# Patient Record
Sex: Male | Born: 1940
Health system: Southern US, Community
[De-identification: ages and names within clinical notes are randomized; demographics above are authoritative.]

## PROBLEM LIST (undated history)

## (undated) DIAGNOSIS — I639 Cerebral infarction, unspecified: Secondary | ICD-10-CM

## (undated) DIAGNOSIS — K219 Gastro-esophageal reflux disease without esophagitis: Secondary | ICD-10-CM

## (undated) DIAGNOSIS — I10 Essential (primary) hypertension: Secondary | ICD-10-CM

## (undated) DIAGNOSIS — I219 Acute myocardial infarction, unspecified: Secondary | ICD-10-CM

## (undated) DIAGNOSIS — C801 Malignant (primary) neoplasm, unspecified: Secondary | ICD-10-CM

## (undated) DIAGNOSIS — E7800 Pure hypercholesterolemia, unspecified: Secondary | ICD-10-CM

## (undated) DIAGNOSIS — F419 Anxiety disorder, unspecified: Secondary | ICD-10-CM

## (undated) DIAGNOSIS — E119 Type 2 diabetes mellitus without complications: Secondary | ICD-10-CM

## (undated) DIAGNOSIS — K635 Polyp of colon: Secondary | ICD-10-CM

## (undated) DIAGNOSIS — F32A Depression, unspecified: Secondary | ICD-10-CM

## (undated) HISTORY — DX: Cerebral infarction, unspecified: I63.9

## (undated) HISTORY — PX: OTHER SURGICAL HISTORY: SHX169

## (undated) HISTORY — PX: COLONOSCOPY: SHX174

## (undated) HISTORY — PX: CARDIAC CATHETERIZATION: SHX172

---

## 1995-06-26 DIAGNOSIS — I219 Acute myocardial infarction, unspecified: Secondary | ICD-10-CM

## 1995-06-26 HISTORY — DX: Acute myocardial infarction, unspecified: I21.9

## 1995-06-26 HISTORY — PX: CORONARY ANGIOPLASTY: SHX604

## 2000-06-25 HISTORY — PX: CORONARY ANGIOPLASTY WITH STENT PLACEMENT: SHX49

## 2010-05-17 ENCOUNTER — Ambulatory Visit: Payer: Self-pay | Admitting: Internal Medicine

## 2010-05-17 ENCOUNTER — Ambulatory Visit (HOSPITAL_COMMUNITY): Admission: RE | Admit: 2010-05-17 | Discharge: 2010-05-17 | Payer: Self-pay | Admitting: Internal Medicine

## 2015-04-28 ENCOUNTER — Encounter (INDEPENDENT_AMBULATORY_CARE_PROVIDER_SITE_OTHER): Payer: Self-pay | Admitting: *Deleted

## 2015-05-13 ENCOUNTER — Other Ambulatory Visit (INDEPENDENT_AMBULATORY_CARE_PROVIDER_SITE_OTHER): Payer: Self-pay | Admitting: *Deleted

## 2015-05-13 ENCOUNTER — Telehealth (INDEPENDENT_AMBULATORY_CARE_PROVIDER_SITE_OTHER): Payer: Self-pay | Admitting: *Deleted

## 2015-05-13 DIAGNOSIS — Z86010 Personal history of colonic polyps: Secondary | ICD-10-CM

## 2015-05-13 DIAGNOSIS — Z1211 Encounter for screening for malignant neoplasm of colon: Secondary | ICD-10-CM

## 2015-05-13 MED ORDER — PEG 3350-KCL-NA BICARB-NACL 420 G PO SOLR: 4000.0000 mL | Freq: Once | ORAL | Status: DC

## 2015-05-13 NOTE — Telephone Encounter (Signed)
Referring MD/PCP: hasanaj   Procedure: tcs  Reason/Indication:  Hx polyps  Has patient had this procedure before?  Yes, 2011 -- paper chart  If so, when, by whom and where?    Is there a family history of colon cancer?    Who?  What age when diagnosed?    Is patient diabetic?   yes      Does patient have prosthetic heart valve or mechanical valve?  no  Do you have a pacemaker?  no  Has patient ever had endocarditis? no  Has patient had joint replacement within last 12 months?  no  Does patient tend to be constipated or take laxatives? no  Does patient have a history of alcohol/drug use?  no  Is patient on Coumadin, Plavix and/or Aspirin? yes  Medications: asa 81 mg daily, Prilosec otc 1 tab every other day, vit c 1000 mg daily, omega 3 fish oil 180 mg bid, centrum silver daily, Ramipril 125 mg daily, vit d3 1000 iu daily, fenofibrate 145 mg daily, atorvastatin 40 mg daily, metformin 1000 mg bid (am & pm)  Allergies: IVP dye  Medication Adjustment: asa 2 days, hold metformin evening before & morning of  Procedure date & time: 05/27/15 at 1030

## 2015-05-13 NOTE — Telephone Encounter (Signed)
agree

## 2015-05-13 NOTE — Telephone Encounter (Signed)
Patient needs trilyte 

## 2015-05-27 ENCOUNTER — Encounter (HOSPITAL_COMMUNITY)
Admission: RE | Disposition: A | Discharge status: Home / Self Care | Payer: Self-pay | Source: Ambulatory Visit | Attending: Internal Medicine

## 2015-05-27 ENCOUNTER — Encounter (HOSPITAL_COMMUNITY): Payer: Self-pay | Admitting: *Deleted

## 2015-05-27 ENCOUNTER — Ambulatory Visit (HOSPITAL_COMMUNITY)
Admission: RE | Admit: 2015-05-27 | Discharge: 2015-05-27 | Disposition: A | Discharge status: Home / Self Care | Payer: Medicare Other | Source: Ambulatory Visit | Attending: Internal Medicine | Admitting: Internal Medicine

## 2015-05-27 DIAGNOSIS — Z1211 Encounter for screening for malignant neoplasm of colon: Secondary | ICD-10-CM | POA: Diagnosis present

## 2015-05-27 DIAGNOSIS — Z87891 Personal history of nicotine dependence: Secondary | ICD-10-CM | POA: Insufficient documentation

## 2015-05-27 DIAGNOSIS — E7800 Pure hypercholesterolemia, unspecified: Secondary | ICD-10-CM | POA: Insufficient documentation

## 2015-05-27 DIAGNOSIS — Z7984 Long term (current) use of oral hypoglycemic drugs: Secondary | ICD-10-CM | POA: Diagnosis not present

## 2015-05-27 DIAGNOSIS — Z7982 Long term (current) use of aspirin: Secondary | ICD-10-CM | POA: Insufficient documentation

## 2015-05-27 DIAGNOSIS — D125 Benign neoplasm of sigmoid colon: Secondary | ICD-10-CM

## 2015-05-27 DIAGNOSIS — I252 Old myocardial infarction: Secondary | ICD-10-CM | POA: Diagnosis not present

## 2015-05-27 DIAGNOSIS — E119 Type 2 diabetes mellitus without complications: Secondary | ICD-10-CM | POA: Diagnosis not present

## 2015-05-27 DIAGNOSIS — I10 Essential (primary) hypertension: Secondary | ICD-10-CM | POA: Insufficient documentation

## 2015-05-27 DIAGNOSIS — Z79899 Other long term (current) drug therapy: Secondary | ICD-10-CM | POA: Diagnosis not present

## 2015-05-27 DIAGNOSIS — K648 Other hemorrhoids: Secondary | ICD-10-CM

## 2015-05-27 DIAGNOSIS — Z85038 Personal history of other malignant neoplasm of large intestine: Secondary | ICD-10-CM | POA: Insufficient documentation

## 2015-05-27 DIAGNOSIS — Z86010 Personal history of colonic polyps: Secondary | ICD-10-CM | POA: Diagnosis not present

## 2015-05-27 DIAGNOSIS — K219 Gastro-esophageal reflux disease without esophagitis: Secondary | ICD-10-CM | POA: Insufficient documentation

## 2015-05-27 DIAGNOSIS — K644 Residual hemorrhoidal skin tags: Secondary | ICD-10-CM | POA: Diagnosis not present

## 2015-05-27 DIAGNOSIS — K5730 Diverticulosis of large intestine without perforation or abscess without bleeding: Secondary | ICD-10-CM | POA: Diagnosis not present

## 2015-05-27 HISTORY — DX: Pure hypercholesterolemia, unspecified: E78.00

## 2015-05-27 HISTORY — DX: Essential (primary) hypertension: I10

## 2015-05-27 HISTORY — PX: COLONOSCOPY: SHX5424

## 2015-05-27 HISTORY — DX: Polyp of colon: K63.5

## 2015-05-27 HISTORY — DX: Gastro-esophageal reflux disease without esophagitis: K21.9

## 2015-05-27 HISTORY — DX: Malignant (primary) neoplasm, unspecified: C80.1

## 2015-05-27 HISTORY — DX: Type 2 diabetes mellitus without complications: E11.9

## 2015-05-27 HISTORY — DX: Acute myocardial infarction, unspecified: I21.9

## 2015-05-27 LAB — GLUCOSE, CAPILLARY: Glucose-Capillary: 71 mg/dL (ref 65–99)

## 2015-05-27 SURGERY — COLONOSCOPY
Anesthesia: Moderate Sedation

## 2015-05-27 MED ORDER — MEPERIDINE HCL 50 MG/ML IJ SOLN
INTRAMUSCULAR | Status: DC | PRN
  Administered 2015-05-27 (×2): 25 mg via INTRAVENOUS

## 2015-05-27 MED ORDER — SIMETHICONE 40 MG/0.6ML PO SUSP
ORAL | Status: DC | PRN
  Administered 2015-05-27: 11:00:00

## 2015-05-27 MED ORDER — MIDAZOLAM HCL 5 MG/5ML IJ SOLN
INTRAMUSCULAR | Status: DC | PRN
  Administered 2015-05-27 (×2): 1 mg via INTRAVENOUS
  Administered 2015-05-27: 2 mg via INTRAVENOUS

## 2015-05-27 MED ORDER — MEPERIDINE HCL 50 MG/ML IJ SOLN
INTRAMUSCULAR | Status: AC
  Filled 2015-05-27: qty 1

## 2015-05-27 MED ORDER — MIDAZOLAM HCL 5 MG/5ML IJ SOLN
INTRAMUSCULAR | Status: AC
  Filled 2015-05-27: qty 10

## 2015-05-27 MED ORDER — SODIUM CHLORIDE 0.9 % IV SOLN
INTRAVENOUS | Status: DC
  Administered 2015-05-27: 10:00:00 via INTRAVENOUS

## 2015-05-27 NOTE — Discharge Instructions (Addendum)
Resume usual medications and diet. °No driving for 24 hours. °Physician will call with biopsy results. ° ° °Colonoscopy, Care After °These instructions give you information on caring for yourself after your procedure. Your doctor may also give you more specific instructions. Call your doctor if you have any problems or questions after your procedure. °HOME CARE °· Do not drive for 24 hours. °· Do not sign important papers or use machinery for 24 hours. °· You may shower. °· You may go back to your usual activities, but go slower for the first 24 hours. °· Take rest breaks often during the first 24 hours. °· Walk around or use warm packs on your belly (abdomen) if you have belly cramping or gas. °· Drink enough fluids to keep your pee (urine) clear or pale yellow. °· Resume your normal diet. Avoid heavy or fried foods. °· Avoid drinking alcohol for 24 hours or as told by your doctor. °· Only take medicines as told by your doctor. °If a tissue sample (biopsy) was taken during the procedure:  °· Do not take aspirin or blood thinners for 7 days, or as told by your doctor. °· Do not drink alcohol for 7 days, or as told by your doctor. °· Eat soft foods for the first 24 hours. °GET HELP IF: °You still have a small amount of blood in your poop (stool) 2-3 days after the procedure. °GET HELP RIGHT AWAY IF: °· You have more than a small amount of blood in your poop. °· You see clumps of tissue (blood clots) in your poop. °· Your belly is puffy (swollen). °· You feel sick to your stomach (nauseous) or throw up (vomit). °· You have a fever. °· You have belly pain that gets worse and medicine does not help. °MAKE SURE YOU: °· Understand these instructions. °· Will watch your condition. °· Will get help right away if you are not doing well or get worse. °  °This information is not intended to replace advice given to you by your health care provider. Make sure you discuss any questions you have with your health care provider. °    °Document Released: 07/14/2010 Document Revised: 06/16/2013 Document Reviewed: 02/16/2013 °Elsevier Interactive Patient Education ©2016 Elsevier Inc[employer] ° °Colon Polyps °Polyps are lumps of extra tissue growing inside the body. Polyps can grow in the large intestine (colon). Most colon polyps are noncancerous (benign). However, some colon polyps can become cancerous over time. Polyps that are larger than a pea may be harmful. To be safe, caregivers remove and test all polyps. °CAUSES  °Polyps form when mutations in the genes cause your cells to grow and divide even though no more tissue is needed. °RISK FACTORS °There are a number of risk factors that can increase your chances of getting colon polyps. They include: °· Being older than 50 years. °· Family history of colon polyps or colon cancer. °· Long-term colon diseases, such as colitis or Crohn disease. °· Being overweight. °· Smoking. °· Being inactive. °· Drinking too much alcohol. °SYMPTOMS  °Most small polyps do not cause symptoms. If symptoms are present, they may include: °· Blood in the stool. The stool may look dark red or black. °· Constipation or diarrhea that lasts longer than 1 week. °DIAGNOSIS °People often do not know they have polyps until their caregiver finds them during a regular checkup. Your caregiver can use 4 tests to check for polyps: °· Digital rectal exam. The caregiver wears gloves and feels inside the rectum.   This test would find polyps only in the rectum. °· Barium enema. The caregiver puts a liquid called barium into your rectum before taking X-rays of your colon. Barium makes your colon look white. Polyps are dark, so they are easy to see in the X-ray pictures. °· Sigmoidoscopy. A thin, flexible tube (sigmoidoscope) is placed into your rectum. The sigmoidoscope has a light and tiny camera in it. The caregiver uses the sigmoidoscope to look at the last third of your colon. °· Colonoscopy. This test is like sigmoidoscopy, but the  caregiver looks at the entire colon. This is the most common method for finding and removing polyps. °TREATMENT  °Any polyps will be removed during a sigmoidoscopy or colonoscopy. The polyps are then tested for cancer. °PREVENTION  °To help lower your risk of getting more colon polyps: °· Eat plenty of fruits and vegetables. Avoid eating fatty foods. °· Do not smoke. °· Avoid drinking alcohol. °· Exercise every day. °· Lose weight if recommended by your caregiver. °· Eat plenty of calcium and folate. Foods that are rich in calcium include milk, cheese, and broccoli. Foods that are rich in folate include chickpeas, kidney beans, and spinach. °HOME CARE INSTRUCTIONS °Keep all follow-up appointments as directed by your caregiver. You may need periodic exams to check for polyps. °SEEK MEDICAL CARE IF: °You notice bleeding during a bowel movement. °  °This information is not intended to replace advice given to you by your health care provider. Make sure you discuss any questions you have with your health care provider. °  °Document Released: 03/07/2004 Document Revised: 07/02/2014 Document Reviewed: 08/21/2011 °Elsevier Interactive Patient Education ©2016 Elsevier Inc. ° °

## 2015-05-27 NOTE — Op Note (Signed)
COLONOSCOPY PROCEDURE REPORT  PATIENT:  Billy Jacobson  MR#:  [id number] Birthdate:  1941/06/18, 74 y.o., male Endoscopist:  Dr. Rogene Houston, MD Referred By:  Dr. Velvet Bathe A. Sherrie Sport, MD Procedure Date: 05/27/2015  Procedure:   Colonoscopy  Indications:  Patient is 74 year old Caucasian male with history of colonic polyps. He is undergoing surveillance colonoscopy.  Informed Consent:  The procedure and risks were reviewed with the patient and informed consent was obtained.  Medications:  Demerol 50 mg IV Versed 4 mg IV  Description of procedure:  After a digital rectal exam was performed, that colonoscope was advanced from the anus through the rectum and colon to the area of the cecum, ileocecal valve and appendiceal orifice. The cecum was deeply intubated. These structures were well-seen and photographed for the record. From the level of the cecum and ileocecal valve, the scope was slowly and cautiously withdrawn. The mucosal surfaces were carefully surveyed utilizing scope tip to flexion to facilitate fold flattening as needed. The scope was pulled down into the rectum where a thorough exam including retroflexion was performed.  Findings:   Prep satisfactory. Single diverticulum noted at ascending colon along with two more at sigmoid colon. Small polyp at proximal sigmoid colon was removed via cold biopsy. Two other small polyps at sigmoid colon were cold snared. All three of these polyps were submitted together. Normal rectal mucosa. Small hemorrhoids above and below the dentate line.    Therapeutic/Diagnostic Maneuvers Performed:  See above  Complications:  None  EBL: None  Cecal Withdrawal Time: 17 minutes  Impression:  Examination performed to cecum. Single diverticulum at ascending colon along with two at sigmoid colon. Three small polyps removed from sigmoid colon and submitted together. Small internal and external hemorrhoids.  Recommendations:  Standard  instructions given. I will contact patient with biopsy results and further recommendations.  [name]  05/27/2015 11:17 AM  CC: Dr. Neale Burly, MD & Dr. Rayne Du ref. provider found

## 2015-05-27 NOTE — H&P (Signed)
Billy Jacobson is an 74 y.o. male.   Chief Complaint: Patient is here for colonoscopy. HPI: Patient is 74 year old Caucasian male was history of colonic polyps and is here for surveillance colonoscopy. He denies abdominal pain change in bowel habits or rectal bleeding. While he has had colonic adenomas on prior colonoscopies for the last exam in November 2011 revealed 2 hyperplastic polyps. Family history is negative for CRC.  Past Medical History  Diagnosis Date  . Diabetes mellitus without complication ([place])   . Hypertension   . Hypercholesteremia   . GERD (gastroesophageal reflux disease)   . Colon polyps   . Myocardial infarction ([place]) 1997  . Cancer [facility])     Colon cancer    Past Surgical History  Procedure Laterality Date  . Cardiac catheterization    . Colonoscopy    . Right inguinal hernia repair    . Left rotator cuff surgery      History reviewed. No pertinent family history. Social History:  reports that he has quit smoking. He does not have any smokeless tobacco history on file. He reports that he drinks alcohol. He reports that he does not use illicit drugs.  Allergies:  Allergies  Allergen Reactions  . Iodinated Diagnostic Agents     Medications Prior to Admission  Medication Sig Dispense Refill  . Ascorbic Acid (VITAMIN C) 1000 MG tablet Take 1,000 mg by mouth 2 (two) times a week.    Marland Kitchen aspirin EC 81 MG tablet Take 81 mg by mouth daily.    Marland Kitchen atorvastatin (LIPITOR) 40 MG tablet Take 40 mg by mouth daily.  0  . Cholecalciferol (VITAMIN D-3) 1000 UNITS CAPS Take 1 capsule by mouth daily.    . fenofibrate (TRICOR) 145 MG tablet Take 145 mg by mouth daily.  0  . metFORMIN (GLUCOPHAGE) 1000 MG tablet Take 1,000 mg by mouth 2 (two) times daily.  0  . Multiple Vitamins-Minerals (CENTRUM SILVER ADULT 50+ PO) Take 1 tablet by mouth daily.    . Omega-3 Fatty Acids (FISH OIL PO) Take 2 capsules by mouth daily.    Marland Kitchen omeprazole (PRILOSEC) 20 MG capsule Take 20 mg by  mouth every other day.  0  . polyethylene glycol-electrolytes (NULYTELY/GOLYTELY) 420 G solution Take 4,000 mLs by mouth once. 4000 mL 0  . ramipril (ALTACE) 1.25 MG capsule Take 1.25 mg by mouth daily.      Results for orders placed or performed during the hospital encounter of 05/27/15 (from the past 48 hour(s))  Glucose, capillary     Status: None   Collection Time: 05/27/15  9:52 AM  Result Value Ref Range   Glucose-Capillary 71 65 - 99 mg/dL   No results found.  ROS  Blood pressure 124/66, pulse 67, temperature 97.5 F (36.4 C), temperature source Oral, resp. rate 15, height 5\' 10"  (1.778 m), weight 155 lb (70.308 kg), SpO2 100 %. Physical Exam  Constitutional: He appears well-developed and well-nourished.  HENT:  Mouth/Throat: Oropharynx is clear and moist.  Eyes: Conjunctivae are normal. No scleral icterus.  Neck: No thyromegaly present.  Cardiovascular:  Occasional irregularity. Normal S1 and S2. No murmur or gallop.  Respiratory: Effort normal and breath sounds normal.  GI: Soft. He exhibits no distension and no mass. There is no tenderness.  Musculoskeletal: He exhibits no edema.  Lymphadenopathy:    He has no cervical adenopathy.  Neurological: He is alert.  Skin: Skin is warm and dry.     Assessment/Plan History of colonic polyps.  Surveillance colonoscopy.  Billy Jacobson U 05/27/2015, 10:31 AM

## 2015-05-30 ENCOUNTER — Encounter (HOSPITAL_COMMUNITY): Payer: Self-pay | Admitting: Internal Medicine

## 2015-07-22 DIAGNOSIS — Z6822 Body mass index (BMI) 22.0-22.9, adult: Secondary | ICD-10-CM | POA: Diagnosis not present

## 2015-07-22 DIAGNOSIS — E1165 Type 2 diabetes mellitus with hyperglycemia: Secondary | ICD-10-CM | POA: Diagnosis not present

## 2015-07-22 DIAGNOSIS — Z0000 Encounter for general adult medical examination without abnormal findings: Secondary | ICD-10-CM | POA: Diagnosis not present

## 2015-07-22 DIAGNOSIS — I10 Essential (primary) hypertension: Secondary | ICD-10-CM | POA: Diagnosis not present

## 2015-07-27 ENCOUNTER — Other Ambulatory Visit: Payer: Self-pay | Admitting: Dermatology

## 2015-07-27 DIAGNOSIS — L570 Actinic keratosis: Secondary | ICD-10-CM | POA: Diagnosis not present

## 2015-07-27 DIAGNOSIS — D239 Other benign neoplasm of skin, unspecified: Secondary | ICD-10-CM | POA: Diagnosis not present

## 2015-09-05 DIAGNOSIS — M5431 Sciatica, right side: Secondary | ICD-10-CM | POA: Diagnosis not present

## 2015-09-05 DIAGNOSIS — Z6823 Body mass index (BMI) 23.0-23.9, adult: Secondary | ICD-10-CM | POA: Diagnosis not present

## 2015-09-07 DIAGNOSIS — M545 Low back pain: Secondary | ICD-10-CM | POA: Diagnosis not present

## 2015-09-07 DIAGNOSIS — S3992XA Unspecified injury of lower back, initial encounter: Secondary | ICD-10-CM | POA: Diagnosis not present

## 2015-09-07 DIAGNOSIS — M5106 Intervertebral disc disorders with myelopathy, lumbar region: Secondary | ICD-10-CM | POA: Diagnosis not present

## 2015-09-07 DIAGNOSIS — M4716 Other spondylosis with myelopathy, lumbar region: Secondary | ICD-10-CM | POA: Diagnosis not present

## 2015-10-06 DIAGNOSIS — M47817 Spondylosis without myelopathy or radiculopathy, lumbosacral region: Secondary | ICD-10-CM | POA: Diagnosis not present

## 2015-10-06 DIAGNOSIS — M5136 Other intervertebral disc degeneration, lumbar region: Secondary | ICD-10-CM | POA: Diagnosis not present

## 2015-10-06 DIAGNOSIS — M9983 Other biomechanical lesions of lumbar region: Secondary | ICD-10-CM | POA: Diagnosis not present

## 2015-10-06 DIAGNOSIS — M4317 Spondylolisthesis, lumbosacral region: Secondary | ICD-10-CM | POA: Diagnosis not present

## 2015-10-19 DIAGNOSIS — M4317 Spondylolisthesis, lumbosacral region: Secondary | ICD-10-CM | POA: Diagnosis not present

## 2015-10-19 DIAGNOSIS — Z79899 Other long term (current) drug therapy: Secondary | ICD-10-CM | POA: Diagnosis not present

## 2015-10-19 DIAGNOSIS — M5136 Other intervertebral disc degeneration, lumbar region: Secondary | ICD-10-CM | POA: Diagnosis not present

## 2015-10-19 DIAGNOSIS — Z7984 Long term (current) use of oral hypoglycemic drugs: Secondary | ICD-10-CM | POA: Diagnosis not present

## 2015-10-19 DIAGNOSIS — M47817 Spondylosis without myelopathy or radiculopathy, lumbosacral region: Secondary | ICD-10-CM | POA: Diagnosis not present

## 2015-10-19 DIAGNOSIS — M9983 Other biomechanical lesions of lumbar region: Secondary | ICD-10-CM | POA: Diagnosis not present

## 2015-10-19 DIAGNOSIS — M47816 Spondylosis without myelopathy or radiculopathy, lumbar region: Secondary | ICD-10-CM | POA: Diagnosis not present

## 2015-11-25 DIAGNOSIS — M7551 Bursitis of right shoulder: Secondary | ICD-10-CM | POA: Diagnosis not present

## 2015-11-25 DIAGNOSIS — M25511 Pain in right shoulder: Secondary | ICD-10-CM | POA: Diagnosis not present

## 2015-12-28 ENCOUNTER — Other Ambulatory Visit: Payer: Self-pay | Admitting: Dermatology

## 2015-12-28 DIAGNOSIS — D239 Other benign neoplasm of skin, unspecified: Secondary | ICD-10-CM | POA: Diagnosis not present

## 2015-12-28 DIAGNOSIS — L299 Pruritus, unspecified: Secondary | ICD-10-CM | POA: Diagnosis not present

## 2015-12-28 DIAGNOSIS — D485 Neoplasm of uncertain behavior of skin: Secondary | ICD-10-CM | POA: Diagnosis not present

## 2015-12-28 DIAGNOSIS — H61001 Unspecified perichondritis of right external ear: Secondary | ICD-10-CM | POA: Diagnosis not present

## 2016-02-03 DIAGNOSIS — Z125 Encounter for screening for malignant neoplasm of prostate: Secondary | ICD-10-CM | POA: Diagnosis not present

## 2016-02-03 DIAGNOSIS — E1165 Type 2 diabetes mellitus with hyperglycemia: Secondary | ICD-10-CM | POA: Diagnosis not present

## 2016-02-03 DIAGNOSIS — E119 Type 2 diabetes mellitus without complications: Secondary | ICD-10-CM | POA: Diagnosis not present

## 2016-02-03 DIAGNOSIS — K219 Gastro-esophageal reflux disease without esophagitis: Secondary | ICD-10-CM | POA: Diagnosis not present

## 2016-02-03 DIAGNOSIS — E782 Mixed hyperlipidemia: Secondary | ICD-10-CM | POA: Diagnosis not present

## 2016-02-03 DIAGNOSIS — E784 Other hyperlipidemia: Secondary | ICD-10-CM | POA: Diagnosis not present

## 2016-03-12 DIAGNOSIS — M25511 Pain in right shoulder: Secondary | ICD-10-CM | POA: Diagnosis not present

## 2016-03-16 DIAGNOSIS — M75101 Unspecified rotator cuff tear or rupture of right shoulder, not specified as traumatic: Secondary | ICD-10-CM | POA: Diagnosis not present

## 2016-03-16 DIAGNOSIS — M7581 Other shoulder lesions, right shoulder: Secondary | ICD-10-CM | POA: Diagnosis not present

## 2016-03-16 DIAGNOSIS — M7551 Bursitis of right shoulder: Secondary | ICD-10-CM | POA: Diagnosis not present

## 2016-03-16 DIAGNOSIS — S46011A Strain of muscle(s) and tendon(s) of the rotator cuff of right shoulder, initial encounter: Secondary | ICD-10-CM | POA: Diagnosis not present

## 2016-03-19 DIAGNOSIS — M75121 Complete rotator cuff tear or rupture of right shoulder, not specified as traumatic: Secondary | ICD-10-CM | POA: Diagnosis not present

## 2016-04-02 DIAGNOSIS — M75111 Incomplete rotator cuff tear or rupture of right shoulder, not specified as traumatic: Secondary | ICD-10-CM | POA: Diagnosis not present

## 2016-04-02 DIAGNOSIS — M7521 Bicipital tendinitis, right shoulder: Secondary | ICD-10-CM | POA: Diagnosis not present

## 2016-05-22 DIAGNOSIS — Z955 Presence of coronary angioplasty implant and graft: Secondary | ICD-10-CM | POA: Diagnosis not present

## 2016-05-22 DIAGNOSIS — Z7984 Long term (current) use of oral hypoglycemic drugs: Secondary | ICD-10-CM | POA: Diagnosis not present

## 2016-05-22 DIAGNOSIS — R740 Nonspecific elevation of levels of transaminase and lactic acid dehydrogenase [LDH]: Secondary | ICD-10-CM | POA: Diagnosis not present

## 2016-05-22 DIAGNOSIS — I252 Old myocardial infarction: Secondary | ICD-10-CM | POA: Diagnosis not present

## 2016-05-22 DIAGNOSIS — Z7982 Long term (current) use of aspirin: Secondary | ICD-10-CM | POA: Diagnosis not present

## 2016-05-22 DIAGNOSIS — Z79899 Other long term (current) drug therapy: Secondary | ICD-10-CM | POA: Diagnosis not present

## 2016-05-22 DIAGNOSIS — E785 Hyperlipidemia, unspecified: Secondary | ICD-10-CM | POA: Diagnosis not present

## 2016-05-22 DIAGNOSIS — I10 Essential (primary) hypertension: Secondary | ICD-10-CM | POA: Diagnosis not present

## 2016-05-22 DIAGNOSIS — I259 Chronic ischemic heart disease, unspecified: Secondary | ICD-10-CM | POA: Diagnosis not present

## 2016-06-11 DIAGNOSIS — M79641 Pain in right hand: Secondary | ICD-10-CM | POA: Diagnosis not present

## 2016-06-11 DIAGNOSIS — G5601 Carpal tunnel syndrome, right upper limb: Secondary | ICD-10-CM | POA: Diagnosis not present

## 2016-06-21 DIAGNOSIS — H40053 Ocular hypertension, bilateral: Secondary | ICD-10-CM | POA: Diagnosis not present

## 2016-06-21 DIAGNOSIS — H524 Presbyopia: Secondary | ICD-10-CM | POA: Diagnosis not present

## 2016-07-27 ENCOUNTER — Encounter: Payer: Self-pay | Admitting: Internal Medicine

## 2016-07-31 DIAGNOSIS — L821 Other seborrheic keratosis: Secondary | ICD-10-CM | POA: Diagnosis not present

## 2016-07-31 DIAGNOSIS — L570 Actinic keratosis: Secondary | ICD-10-CM | POA: Diagnosis not present

## 2016-08-03 DIAGNOSIS — K219 Gastro-esophageal reflux disease without esophagitis: Secondary | ICD-10-CM | POA: Diagnosis not present

## 2016-08-03 DIAGNOSIS — E784 Other hyperlipidemia: Secondary | ICD-10-CM | POA: Diagnosis not present

## 2016-08-03 DIAGNOSIS — I10 Essential (primary) hypertension: Secondary | ICD-10-CM | POA: Diagnosis not present

## 2016-08-03 DIAGNOSIS — Z1389 Encounter for screening for other disorder: Secondary | ICD-10-CM | POA: Diagnosis not present

## 2016-08-03 DIAGNOSIS — E1165 Type 2 diabetes mellitus with hyperglycemia: Secondary | ICD-10-CM | POA: Diagnosis not present

## 2016-08-03 DIAGNOSIS — Z0000 Encounter for general adult medical examination without abnormal findings: Secondary | ICD-10-CM | POA: Diagnosis not present

## 2016-08-06 DIAGNOSIS — M2392 Unspecified internal derangement of left knee: Secondary | ICD-10-CM | POA: Diagnosis not present

## 2016-08-06 DIAGNOSIS — M25562 Pain in left knee: Secondary | ICD-10-CM | POA: Diagnosis not present

## 2016-08-20 DIAGNOSIS — M2392 Unspecified internal derangement of left knee: Secondary | ICD-10-CM | POA: Diagnosis not present

## 2016-09-27 DIAGNOSIS — M9901 Segmental and somatic dysfunction of cervical region: Secondary | ICD-10-CM | POA: Diagnosis not present

## 2016-09-27 DIAGNOSIS — M9903 Segmental and somatic dysfunction of lumbar region: Secondary | ICD-10-CM | POA: Diagnosis not present

## 2016-09-27 DIAGNOSIS — M9902 Segmental and somatic dysfunction of thoracic region: Secondary | ICD-10-CM | POA: Diagnosis not present

## 2016-09-27 DIAGNOSIS — S233XXA Sprain of ligaments of thoracic spine, initial encounter: Secondary | ICD-10-CM | POA: Diagnosis not present

## 2016-09-27 DIAGNOSIS — M546 Pain in thoracic spine: Secondary | ICD-10-CM | POA: Diagnosis not present

## 2016-10-04 DIAGNOSIS — M546 Pain in thoracic spine: Secondary | ICD-10-CM | POA: Diagnosis not present

## 2016-10-04 DIAGNOSIS — M9902 Segmental and somatic dysfunction of thoracic region: Secondary | ICD-10-CM | POA: Diagnosis not present

## 2016-10-04 DIAGNOSIS — M9903 Segmental and somatic dysfunction of lumbar region: Secondary | ICD-10-CM | POA: Diagnosis not present

## 2016-10-04 DIAGNOSIS — S233XXA Sprain of ligaments of thoracic spine, initial encounter: Secondary | ICD-10-CM | POA: Diagnosis not present

## 2016-10-04 DIAGNOSIS — M9901 Segmental and somatic dysfunction of cervical region: Secondary | ICD-10-CM | POA: Diagnosis not present

## 2016-10-09 DIAGNOSIS — M9901 Segmental and somatic dysfunction of cervical region: Secondary | ICD-10-CM | POA: Diagnosis not present

## 2016-10-09 DIAGNOSIS — M9902 Segmental and somatic dysfunction of thoracic region: Secondary | ICD-10-CM | POA: Diagnosis not present

## 2016-10-09 DIAGNOSIS — M546 Pain in thoracic spine: Secondary | ICD-10-CM | POA: Diagnosis not present

## 2016-10-09 DIAGNOSIS — S233XXA Sprain of ligaments of thoracic spine, initial encounter: Secondary | ICD-10-CM | POA: Diagnosis not present

## 2016-10-09 DIAGNOSIS — M9903 Segmental and somatic dysfunction of lumbar region: Secondary | ICD-10-CM | POA: Diagnosis not present

## 2016-10-16 DIAGNOSIS — M9901 Segmental and somatic dysfunction of cervical region: Secondary | ICD-10-CM | POA: Diagnosis not present

## 2016-10-16 DIAGNOSIS — S233XXA Sprain of ligaments of thoracic spine, initial encounter: Secondary | ICD-10-CM | POA: Diagnosis not present

## 2016-10-16 DIAGNOSIS — M9903 Segmental and somatic dysfunction of lumbar region: Secondary | ICD-10-CM | POA: Diagnosis not present

## 2016-10-16 DIAGNOSIS — M546 Pain in thoracic spine: Secondary | ICD-10-CM | POA: Diagnosis not present

## 2016-10-16 DIAGNOSIS — M9902 Segmental and somatic dysfunction of thoracic region: Secondary | ICD-10-CM | POA: Diagnosis not present

## 2016-10-23 DIAGNOSIS — S233XXA Sprain of ligaments of thoracic spine, initial encounter: Secondary | ICD-10-CM | POA: Diagnosis not present

## 2016-10-23 DIAGNOSIS — M546 Pain in thoracic spine: Secondary | ICD-10-CM | POA: Diagnosis not present

## 2016-10-23 DIAGNOSIS — M9902 Segmental and somatic dysfunction of thoracic region: Secondary | ICD-10-CM | POA: Diagnosis not present

## 2016-10-23 DIAGNOSIS — M9903 Segmental and somatic dysfunction of lumbar region: Secondary | ICD-10-CM | POA: Diagnosis not present

## 2016-10-23 DIAGNOSIS — M9901 Segmental and somatic dysfunction of cervical region: Secondary | ICD-10-CM | POA: Diagnosis not present

## 2016-10-30 DIAGNOSIS — S233XXA Sprain of ligaments of thoracic spine, initial encounter: Secondary | ICD-10-CM | POA: Diagnosis not present

## 2016-10-30 DIAGNOSIS — M9902 Segmental and somatic dysfunction of thoracic region: Secondary | ICD-10-CM | POA: Diagnosis not present

## 2016-10-30 DIAGNOSIS — M9903 Segmental and somatic dysfunction of lumbar region: Secondary | ICD-10-CM | POA: Diagnosis not present

## 2016-10-30 DIAGNOSIS — M9901 Segmental and somatic dysfunction of cervical region: Secondary | ICD-10-CM | POA: Diagnosis not present

## 2016-10-30 DIAGNOSIS — M546 Pain in thoracic spine: Secondary | ICD-10-CM | POA: Diagnosis not present

## 2016-12-13 DIAGNOSIS — K219 Gastro-esophageal reflux disease without esophagitis: Secondary | ICD-10-CM | POA: Diagnosis not present

## 2016-12-13 DIAGNOSIS — Z87891 Personal history of nicotine dependence: Secondary | ICD-10-CM | POA: Diagnosis not present

## 2016-12-13 DIAGNOSIS — E119 Type 2 diabetes mellitus without complications: Secondary | ICD-10-CM | POA: Diagnosis not present

## 2016-12-13 DIAGNOSIS — M549 Dorsalgia, unspecified: Secondary | ICD-10-CM | POA: Diagnosis not present

## 2016-12-13 DIAGNOSIS — Z7952 Long term (current) use of systemic steroids: Secondary | ICD-10-CM | POA: Diagnosis not present

## 2016-12-13 DIAGNOSIS — M810 Age-related osteoporosis without current pathological fracture: Secondary | ICD-10-CM | POA: Diagnosis not present

## 2016-12-13 DIAGNOSIS — E7800 Pure hypercholesterolemia, unspecified: Secondary | ICD-10-CM | POA: Diagnosis not present

## 2016-12-13 DIAGNOSIS — Z79899 Other long term (current) drug therapy: Secondary | ICD-10-CM | POA: Diagnosis not present

## 2016-12-13 DIAGNOSIS — Z7984 Long term (current) use of oral hypoglycemic drugs: Secondary | ICD-10-CM | POA: Diagnosis not present

## 2017-01-28 DIAGNOSIS — H40023 Open angle with borderline findings, high risk, bilateral: Secondary | ICD-10-CM | POA: Diagnosis not present

## 2017-01-29 DIAGNOSIS — Z6822 Body mass index (BMI) 22.0-22.9, adult: Secondary | ICD-10-CM | POA: Diagnosis not present

## 2017-01-29 DIAGNOSIS — E1165 Type 2 diabetes mellitus with hyperglycemia: Secondary | ICD-10-CM | POA: Diagnosis not present

## 2017-01-29 DIAGNOSIS — I10 Essential (primary) hypertension: Secondary | ICD-10-CM | POA: Diagnosis not present

## 2017-01-29 DIAGNOSIS — E784 Other hyperlipidemia: Secondary | ICD-10-CM | POA: Diagnosis not present

## 2017-01-29 DIAGNOSIS — K219 Gastro-esophageal reflux disease without esophagitis: Secondary | ICD-10-CM | POA: Diagnosis not present

## 2017-02-05 ENCOUNTER — Other Ambulatory Visit: Payer: Self-pay | Admitting: Internal Medicine

## 2017-02-05 DIAGNOSIS — M545 Low back pain: Principal | ICD-10-CM

## 2017-02-05 DIAGNOSIS — G8929 Other chronic pain: Secondary | ICD-10-CM

## 2017-02-08 ENCOUNTER — Ambulatory Visit
Admission: RE | Admit: 2017-02-08 | Discharge: 2017-02-08 | Disposition: A | Discharge status: Home / Self Care | Payer: Medicare Other | Source: Ambulatory Visit | Attending: Internal Medicine | Admitting: Internal Medicine

## 2017-02-08 DIAGNOSIS — M5126 Other intervertebral disc displacement, lumbar region: Secondary | ICD-10-CM | POA: Diagnosis not present

## 2017-02-08 DIAGNOSIS — M545 Low back pain: Principal | ICD-10-CM

## 2017-02-08 DIAGNOSIS — G8929 Other chronic pain: Secondary | ICD-10-CM

## 2017-02-08 MED ORDER — IOPAMIDOL (ISOVUE-M 200) INJECTION 41%
1.0000 mL | Freq: Once | INTRAMUSCULAR | Status: AC
  Administered 2017-02-08: 1 mL via EPIDURAL

## 2017-02-08 MED ORDER — METHYLPREDNISOLONE ACETATE 40 MG/ML INJ SUSP (RADIOLOG
120.0000 mg | Freq: Once | INTRAMUSCULAR | Status: AC
  Administered 2017-02-08: 120 mg via EPIDURAL

## 2017-02-08 NOTE — Discharge Instructions (Signed)

## 2017-02-11 DIAGNOSIS — H40013 Open angle with borderline findings, low risk, bilateral: Secondary | ICD-10-CM | POA: Diagnosis not present

## 2017-04-09 DIAGNOSIS — E119 Type 2 diabetes mellitus without complications: Secondary | ICD-10-CM | POA: Diagnosis not present

## 2017-04-09 DIAGNOSIS — H2702 Aphakia, left eye: Secondary | ICD-10-CM | POA: Diagnosis not present

## 2017-04-09 DIAGNOSIS — H2511 Age-related nuclear cataract, right eye: Secondary | ICD-10-CM | POA: Diagnosis not present

## 2017-06-06 ENCOUNTER — Other Ambulatory Visit: Payer: Self-pay | Admitting: Internal Medicine

## 2017-06-06 DIAGNOSIS — M545 Low back pain: Principal | ICD-10-CM

## 2017-06-06 DIAGNOSIS — G8929 Other chronic pain: Secondary | ICD-10-CM

## 2017-06-20 ENCOUNTER — Ambulatory Visit
Admission: RE | Admit: 2017-06-20 | Discharge: 2017-06-20 | Disposition: A | Discharge status: Home / Self Care | Payer: Medicare Other | Source: Ambulatory Visit | Attending: Internal Medicine | Admitting: Internal Medicine

## 2017-06-20 DIAGNOSIS — M545 Low back pain: Principal | ICD-10-CM

## 2017-06-20 DIAGNOSIS — M5116 Intervertebral disc disorders with radiculopathy, lumbar region: Secondary | ICD-10-CM | POA: Diagnosis not present

## 2017-06-20 DIAGNOSIS — G8929 Other chronic pain: Secondary | ICD-10-CM

## 2017-06-20 MED ORDER — METHYLPREDNISOLONE ACETATE 40 MG/ML INJ SUSP (RADIOLOG
120.0000 mg | Freq: Once | INTRAMUSCULAR | Status: AC
  Administered 2017-06-20: 120 mg via EPIDURAL

## 2017-06-20 MED ORDER — IOPAMIDOL (ISOVUE-M 200) INJECTION 41%
1.0000 mL | Freq: Once | INTRAMUSCULAR | Status: AC
  Administered 2017-06-20: 1 mL via EPIDURAL

## 2017-06-25 HISTORY — PX: CAROTID ANGIOGRAPHY: CATH118230

## 2017-07-19 DIAGNOSIS — M9902 Segmental and somatic dysfunction of thoracic region: Secondary | ICD-10-CM | POA: Diagnosis not present

## 2017-07-19 DIAGNOSIS — M546 Pain in thoracic spine: Secondary | ICD-10-CM | POA: Diagnosis not present

## 2017-07-19 DIAGNOSIS — M9903 Segmental and somatic dysfunction of lumbar region: Secondary | ICD-10-CM | POA: Diagnosis not present

## 2017-07-19 DIAGNOSIS — S233XXA Sprain of ligaments of thoracic spine, initial encounter: Secondary | ICD-10-CM | POA: Diagnosis not present

## 2017-07-19 DIAGNOSIS — M9901 Segmental and somatic dysfunction of cervical region: Secondary | ICD-10-CM | POA: Diagnosis not present

## 2017-07-26 DIAGNOSIS — M9902 Segmental and somatic dysfunction of thoracic region: Secondary | ICD-10-CM | POA: Diagnosis not present

## 2017-07-26 DIAGNOSIS — S233XXA Sprain of ligaments of thoracic spine, initial encounter: Secondary | ICD-10-CM | POA: Diagnosis not present

## 2017-07-26 DIAGNOSIS — M9903 Segmental and somatic dysfunction of lumbar region: Secondary | ICD-10-CM | POA: Diagnosis not present

## 2017-07-26 DIAGNOSIS — M9901 Segmental and somatic dysfunction of cervical region: Secondary | ICD-10-CM | POA: Diagnosis not present

## 2017-07-26 DIAGNOSIS — M546 Pain in thoracic spine: Secondary | ICD-10-CM | POA: Diagnosis not present

## 2017-08-01 DIAGNOSIS — Z125 Encounter for screening for malignant neoplasm of prostate: Secondary | ICD-10-CM | POA: Diagnosis not present

## 2017-08-01 DIAGNOSIS — E1165 Type 2 diabetes mellitus with hyperglycemia: Secondary | ICD-10-CM | POA: Diagnosis not present

## 2017-08-01 DIAGNOSIS — K219 Gastro-esophageal reflux disease without esophagitis: Secondary | ICD-10-CM | POA: Diagnosis not present

## 2017-08-01 DIAGNOSIS — E7849 Other hyperlipidemia: Secondary | ICD-10-CM | POA: Diagnosis not present

## 2017-08-01 DIAGNOSIS — I10 Essential (primary) hypertension: Secondary | ICD-10-CM | POA: Diagnosis not present

## 2017-08-01 DIAGNOSIS — E119 Type 2 diabetes mellitus without complications: Secondary | ICD-10-CM | POA: Diagnosis not present

## 2017-08-01 DIAGNOSIS — Z6822 Body mass index (BMI) 22.0-22.9, adult: Secondary | ICD-10-CM | POA: Diagnosis not present

## 2017-08-02 DIAGNOSIS — M546 Pain in thoracic spine: Secondary | ICD-10-CM | POA: Diagnosis not present

## 2017-08-02 DIAGNOSIS — M9901 Segmental and somatic dysfunction of cervical region: Secondary | ICD-10-CM | POA: Diagnosis not present

## 2017-08-02 DIAGNOSIS — S233XXA Sprain of ligaments of thoracic spine, initial encounter: Secondary | ICD-10-CM | POA: Diagnosis not present

## 2017-08-02 DIAGNOSIS — M9903 Segmental and somatic dysfunction of lumbar region: Secondary | ICD-10-CM | POA: Diagnosis not present

## 2017-08-02 DIAGNOSIS — M9902 Segmental and somatic dysfunction of thoracic region: Secondary | ICD-10-CM | POA: Diagnosis not present

## 2017-08-13 DIAGNOSIS — E785 Hyperlipidemia, unspecified: Secondary | ICD-10-CM | POA: Diagnosis not present

## 2017-08-13 DIAGNOSIS — I2510 Atherosclerotic heart disease of native coronary artery without angina pectoris: Secondary | ICD-10-CM | POA: Diagnosis not present

## 2017-08-13 DIAGNOSIS — I259 Chronic ischemic heart disease, unspecified: Secondary | ICD-10-CM | POA: Diagnosis not present

## 2017-08-13 DIAGNOSIS — I10 Essential (primary) hypertension: Secondary | ICD-10-CM | POA: Diagnosis not present

## 2017-08-13 DIAGNOSIS — I252 Old myocardial infarction: Secondary | ICD-10-CM | POA: Diagnosis not present

## 2017-08-13 DIAGNOSIS — Z955 Presence of coronary angioplasty implant and graft: Secondary | ICD-10-CM | POA: Diagnosis not present

## 2017-08-13 DIAGNOSIS — Z79899 Other long term (current) drug therapy: Secondary | ICD-10-CM | POA: Diagnosis not present

## 2017-08-13 DIAGNOSIS — Z9861 Coronary angioplasty status: Secondary | ICD-10-CM | POA: Diagnosis not present

## 2017-08-21 DIAGNOSIS — M546 Pain in thoracic spine: Secondary | ICD-10-CM | POA: Diagnosis not present

## 2017-08-21 DIAGNOSIS — S233XXA Sprain of ligaments of thoracic spine, initial encounter: Secondary | ICD-10-CM | POA: Diagnosis not present

## 2017-08-21 DIAGNOSIS — M9903 Segmental and somatic dysfunction of lumbar region: Secondary | ICD-10-CM | POA: Diagnosis not present

## 2017-08-21 DIAGNOSIS — M9902 Segmental and somatic dysfunction of thoracic region: Secondary | ICD-10-CM | POA: Diagnosis not present

## 2017-08-21 DIAGNOSIS — M9901 Segmental and somatic dysfunction of cervical region: Secondary | ICD-10-CM | POA: Diagnosis not present

## 2017-08-26 DIAGNOSIS — M9902 Segmental and somatic dysfunction of thoracic region: Secondary | ICD-10-CM | POA: Diagnosis not present

## 2017-08-26 DIAGNOSIS — M9901 Segmental and somatic dysfunction of cervical region: Secondary | ICD-10-CM | POA: Diagnosis not present

## 2017-08-26 DIAGNOSIS — M546 Pain in thoracic spine: Secondary | ICD-10-CM | POA: Diagnosis not present

## 2017-08-26 DIAGNOSIS — M9903 Segmental and somatic dysfunction of lumbar region: Secondary | ICD-10-CM | POA: Diagnosis not present

## 2017-08-26 DIAGNOSIS — S233XXA Sprain of ligaments of thoracic spine, initial encounter: Secondary | ICD-10-CM | POA: Diagnosis not present

## 2017-08-27 DIAGNOSIS — M9902 Segmental and somatic dysfunction of thoracic region: Secondary | ICD-10-CM | POA: Diagnosis not present

## 2017-08-27 DIAGNOSIS — S233XXA Sprain of ligaments of thoracic spine, initial encounter: Secondary | ICD-10-CM | POA: Diagnosis not present

## 2017-08-27 DIAGNOSIS — M9903 Segmental and somatic dysfunction of lumbar region: Secondary | ICD-10-CM | POA: Diagnosis not present

## 2017-08-27 DIAGNOSIS — M9901 Segmental and somatic dysfunction of cervical region: Secondary | ICD-10-CM | POA: Diagnosis not present

## 2017-08-27 DIAGNOSIS — M546 Pain in thoracic spine: Secondary | ICD-10-CM | POA: Diagnosis not present

## 2017-11-13 DIAGNOSIS — G5602 Carpal tunnel syndrome, left upper limb: Secondary | ICD-10-CM | POA: Diagnosis not present

## 2017-11-13 DIAGNOSIS — Z0000 Encounter for general adult medical examination without abnormal findings: Secondary | ICD-10-CM | POA: Diagnosis not present

## 2017-11-13 DIAGNOSIS — Z1389 Encounter for screening for other disorder: Secondary | ICD-10-CM | POA: Diagnosis not present

## 2017-11-13 DIAGNOSIS — Z6822 Body mass index (BMI) 22.0-22.9, adult: Secondary | ICD-10-CM | POA: Diagnosis not present

## 2018-01-29 DIAGNOSIS — I10 Essential (primary) hypertension: Secondary | ICD-10-CM | POA: Diagnosis not present

## 2018-01-29 DIAGNOSIS — E119 Type 2 diabetes mellitus without complications: Secondary | ICD-10-CM | POA: Diagnosis not present

## 2018-01-29 DIAGNOSIS — Z6822 Body mass index (BMI) 22.0-22.9, adult: Secondary | ICD-10-CM | POA: Diagnosis not present

## 2018-01-29 DIAGNOSIS — G5602 Carpal tunnel syndrome, left upper limb: Secondary | ICD-10-CM | POA: Diagnosis not present

## 2018-01-29 DIAGNOSIS — Z0000 Encounter for general adult medical examination without abnormal findings: Secondary | ICD-10-CM | POA: Diagnosis not present

## 2018-01-29 DIAGNOSIS — E782 Mixed hyperlipidemia: Secondary | ICD-10-CM | POA: Diagnosis not present

## 2018-01-31 DIAGNOSIS — Z7689 Persons encountering health services in other specified circumstances: Secondary | ICD-10-CM | POA: Diagnosis not present

## 2018-03-25 ENCOUNTER — Other Ambulatory Visit: Payer: Self-pay | Admitting: Dermatology

## 2018-03-25 DIAGNOSIS — D485 Neoplasm of uncertain behavior of skin: Secondary | ICD-10-CM | POA: Diagnosis not present

## 2018-03-25 DIAGNOSIS — D229 Melanocytic nevi, unspecified: Secondary | ICD-10-CM | POA: Diagnosis not present

## 2018-03-25 DIAGNOSIS — L309 Dermatitis, unspecified: Secondary | ICD-10-CM | POA: Diagnosis not present

## 2018-03-25 DIAGNOSIS — L821 Other seborrheic keratosis: Secondary | ICD-10-CM | POA: Diagnosis not present

## 2018-03-25 DIAGNOSIS — D224 Melanocytic nevi of scalp and neck: Secondary | ICD-10-CM | POA: Diagnosis not present

## 2018-04-10 DIAGNOSIS — E119 Type 2 diabetes mellitus without complications: Secondary | ICD-10-CM | POA: Diagnosis not present

## 2018-04-10 DIAGNOSIS — H2702 Aphakia, left eye: Secondary | ICD-10-CM | POA: Diagnosis not present

## 2018-04-10 DIAGNOSIS — H2511 Age-related nuclear cataract, right eye: Secondary | ICD-10-CM | POA: Diagnosis not present

## 2018-07-14 IMAGING — XA Imaging study
1 series · 1 of 1 positions shown · non-contrast
Comparison: none

CLINICAL DATA: Chronic low back pain. Right lower extremity
radiculopathy. Right L5 radiculopathy. Right-sided foraminal
spurring and disc displacement.

[Series 1: ortho standard · 1 of 1 slices shown]
[im 1/1]
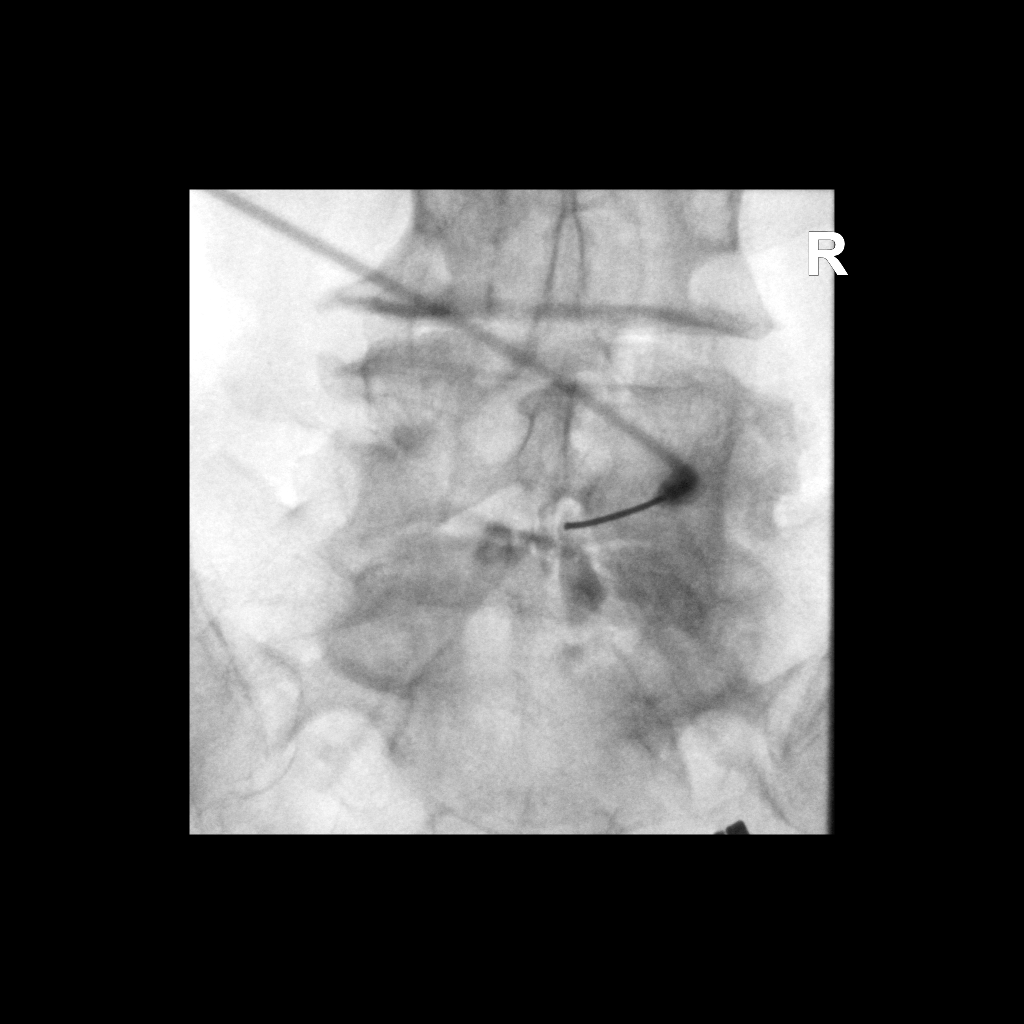

[1 of 1 positions shown; findings below may reference images not displayed]

FLUOROSCOPY TIME:  Radiation Exposure Index (as provided by the
fluoroscopic device): 10.84 uGy*m2

If the device does not provide the exposure index:Fluoroscopy Time:
10 seconds

Number of Acquired Images:  0

PROCEDURE:
The procedure, risks, benefits, and alternatives were explained to
the patient. Questions regarding the procedure were encouraged and
answered. The patient understands and consents to the procedure.

LUMBAR EPIDURAL INJECTION:

An interlaminar approach was performed on right at L5-S1. The
overlying skin was cleansed and anesthetized. A 20 gauge epidural
needle was advanced using loss-of-resistance technique.

DIAGNOSTIC EPIDURAL INJECTION:

Injection of Isovue-M 200 shows a good epidural pattern with spread
above and below the level of needle placement, primarily on the
right no vascular opacification is seen.

THERAPEUTIC EPIDURAL INJECTION:

120 Mg of Depo-Medrol mixed with 3 mL 1% lidocaine were instilled.
The procedure was well-tolerated, and the patient was discharged
thirty minutes following the injection in good condition.

COMPLICATIONS:
None
IMPRESSION: Technically successful epidural injection on the right L5-S1 # 1

## 2018-07-23 DIAGNOSIS — G459 Transient cerebral ischemic attack, unspecified: Secondary | ICD-10-CM | POA: Diagnosis not present

## 2018-07-23 DIAGNOSIS — Z6822 Body mass index (BMI) 22.0-22.9, adult: Secondary | ICD-10-CM | POA: Diagnosis not present

## 2018-07-28 DIAGNOSIS — I6523 Occlusion and stenosis of bilateral carotid arteries: Secondary | ICD-10-CM | POA: Diagnosis not present

## 2018-07-28 DIAGNOSIS — G459 Transient cerebral ischemic attack, unspecified: Secondary | ICD-10-CM | POA: Diagnosis not present

## 2018-07-30 DIAGNOSIS — Z6823 Body mass index (BMI) 23.0-23.9, adult: Secondary | ICD-10-CM | POA: Diagnosis not present

## 2018-07-30 DIAGNOSIS — Z1389 Encounter for screening for other disorder: Secondary | ICD-10-CM | POA: Diagnosis not present

## 2018-07-30 DIAGNOSIS — I10 Essential (primary) hypertension: Secondary | ICD-10-CM | POA: Diagnosis not present

## 2018-07-30 DIAGNOSIS — E1159 Type 2 diabetes mellitus with other circulatory complications: Secondary | ICD-10-CM | POA: Diagnosis not present

## 2018-07-30 DIAGNOSIS — E7849 Other hyperlipidemia: Secondary | ICD-10-CM | POA: Diagnosis not present

## 2018-07-30 DIAGNOSIS — Z0000 Encounter for general adult medical examination without abnormal findings: Secondary | ICD-10-CM | POA: Diagnosis not present

## 2018-08-06 DIAGNOSIS — I700 Atherosclerosis of aorta: Secondary | ICD-10-CM | POA: Diagnosis not present

## 2018-08-06 DIAGNOSIS — G459 Transient cerebral ischemic attack, unspecified: Secondary | ICD-10-CM | POA: Diagnosis not present

## 2018-08-06 DIAGNOSIS — I340 Nonrheumatic mitral (valve) insufficiency: Secondary | ICD-10-CM | POA: Diagnosis not present

## 2018-08-19 DIAGNOSIS — Z79899 Other long term (current) drug therapy: Secondary | ICD-10-CM | POA: Diagnosis not present

## 2018-08-19 DIAGNOSIS — Z7902 Long term (current) use of antithrombotics/antiplatelets: Secondary | ICD-10-CM | POA: Diagnosis not present

## 2018-08-19 DIAGNOSIS — E785 Hyperlipidemia, unspecified: Secondary | ICD-10-CM | POA: Diagnosis not present

## 2018-08-19 DIAGNOSIS — I252 Old myocardial infarction: Secondary | ICD-10-CM | POA: Diagnosis not present

## 2018-08-19 DIAGNOSIS — I6523 Occlusion and stenosis of bilateral carotid arteries: Secondary | ICD-10-CM | POA: Diagnosis not present

## 2018-08-19 DIAGNOSIS — Z7982 Long term (current) use of aspirin: Secondary | ICD-10-CM | POA: Diagnosis not present

## 2018-08-19 DIAGNOSIS — Z955 Presence of coronary angioplasty implant and graft: Secondary | ICD-10-CM | POA: Diagnosis not present

## 2018-08-19 DIAGNOSIS — I259 Chronic ischemic heart disease, unspecified: Secondary | ICD-10-CM | POA: Diagnosis not present

## 2018-08-19 DIAGNOSIS — Z952 Presence of prosthetic heart valve: Secondary | ICD-10-CM | POA: Diagnosis not present

## 2018-08-19 DIAGNOSIS — I10 Essential (primary) hypertension: Secondary | ICD-10-CM | POA: Diagnosis not present

## 2018-08-19 DIAGNOSIS — Z8673 Personal history of transient ischemic attack (TIA), and cerebral infarction without residual deficits: Secondary | ICD-10-CM | POA: Diagnosis not present

## 2018-08-27 DIAGNOSIS — I491 Atrial premature depolarization: Secondary | ICD-10-CM | POA: Diagnosis not present

## 2018-08-27 DIAGNOSIS — E785 Hyperlipidemia, unspecified: Secondary | ICD-10-CM | POA: Diagnosis not present

## 2018-08-27 DIAGNOSIS — I4519 Other right bundle-branch block: Secondary | ICD-10-CM | POA: Diagnosis not present

## 2018-08-27 DIAGNOSIS — I259 Chronic ischemic heart disease, unspecified: Secondary | ICD-10-CM | POA: Diagnosis not present

## 2018-08-27 DIAGNOSIS — I6522 Occlusion and stenosis of left carotid artery: Secondary | ICD-10-CM | POA: Diagnosis not present

## 2018-08-27 DIAGNOSIS — G459 Transient cerebral ischemic attack, unspecified: Secondary | ICD-10-CM | POA: Diagnosis not present

## 2018-08-27 DIAGNOSIS — Z01810 Encounter for preprocedural cardiovascular examination: Secondary | ICD-10-CM | POA: Diagnosis not present

## 2018-08-27 DIAGNOSIS — Z955 Presence of coronary angioplasty implant and graft: Secondary | ICD-10-CM | POA: Diagnosis not present

## 2018-08-27 DIAGNOSIS — E119 Type 2 diabetes mellitus without complications: Secondary | ICD-10-CM | POA: Diagnosis not present

## 2018-08-27 DIAGNOSIS — Z01818 Encounter for other preprocedural examination: Secondary | ICD-10-CM | POA: Diagnosis not present

## 2018-09-02 DIAGNOSIS — R4701 Aphasia: Secondary | ICD-10-CM | POA: Diagnosis not present

## 2018-09-02 DIAGNOSIS — I2510 Atherosclerotic heart disease of native coronary artery without angina pectoris: Secondary | ICD-10-CM | POA: Diagnosis not present

## 2018-09-02 DIAGNOSIS — E785 Hyperlipidemia, unspecified: Secondary | ICD-10-CM | POA: Diagnosis not present

## 2018-09-02 DIAGNOSIS — Z7984 Long term (current) use of oral hypoglycemic drugs: Secondary | ICD-10-CM | POA: Diagnosis not present

## 2018-09-02 DIAGNOSIS — Z955 Presence of coronary angioplasty implant and graft: Secondary | ICD-10-CM | POA: Diagnosis not present

## 2018-09-02 DIAGNOSIS — I6522 Occlusion and stenosis of left carotid artery: Secondary | ICD-10-CM | POA: Diagnosis not present

## 2018-09-02 DIAGNOSIS — Z79899 Other long term (current) drug therapy: Secondary | ICD-10-CM | POA: Diagnosis not present

## 2018-09-02 DIAGNOSIS — I700 Atherosclerosis of aorta: Secondary | ICD-10-CM | POA: Diagnosis not present

## 2018-09-02 DIAGNOSIS — Z8673 Personal history of transient ischemic attack (TIA), and cerebral infarction without residual deficits: Secondary | ICD-10-CM | POA: Diagnosis not present

## 2018-09-02 DIAGNOSIS — I252 Old myocardial infarction: Secondary | ICD-10-CM | POA: Diagnosis not present

## 2018-09-02 DIAGNOSIS — Z7902 Long term (current) use of antithrombotics/antiplatelets: Secondary | ICD-10-CM | POA: Diagnosis not present

## 2018-09-02 DIAGNOSIS — E119 Type 2 diabetes mellitus without complications: Secondary | ICD-10-CM | POA: Diagnosis not present

## 2018-09-02 DIAGNOSIS — Z7982 Long term (current) use of aspirin: Secondary | ICD-10-CM | POA: Diagnosis not present

## 2018-09-02 DIAGNOSIS — Z87891 Personal history of nicotine dependence: Secondary | ICD-10-CM | POA: Diagnosis not present

## 2018-09-02 DIAGNOSIS — G459 Transient cerebral ischemic attack, unspecified: Secondary | ICD-10-CM | POA: Diagnosis not present

## 2018-09-02 DIAGNOSIS — Z9889 Other specified postprocedural states: Secondary | ICD-10-CM | POA: Diagnosis not present

## 2018-09-02 DIAGNOSIS — I10 Essential (primary) hypertension: Secondary | ICD-10-CM | POA: Diagnosis not present

## 2018-09-09 DIAGNOSIS — I259 Chronic ischemic heart disease, unspecified: Secondary | ICD-10-CM | POA: Diagnosis not present

## 2018-09-09 DIAGNOSIS — G459 Transient cerebral ischemic attack, unspecified: Secondary | ICD-10-CM | POA: Diagnosis not present

## 2018-09-09 DIAGNOSIS — E785 Hyperlipidemia, unspecified: Secondary | ICD-10-CM | POA: Diagnosis not present

## 2018-09-10 DIAGNOSIS — I491 Atrial premature depolarization: Secondary | ICD-10-CM | POA: Diagnosis not present

## 2018-09-10 DIAGNOSIS — I471 Supraventricular tachycardia: Secondary | ICD-10-CM | POA: Diagnosis not present

## 2018-10-01 DIAGNOSIS — Z9862 Peripheral vascular angioplasty status: Secondary | ICD-10-CM | POA: Diagnosis not present

## 2018-10-01 DIAGNOSIS — I6523 Occlusion and stenosis of bilateral carotid arteries: Secondary | ICD-10-CM | POA: Diagnosis not present

## 2018-11-23 IMAGING — XA Imaging study
2 series · 2 of 2 positions shown · non-contrast
Comparison: none

CLINICAL DATA: Lumbosacral spondylosis. Displacement of the L5-S1
lumbar disc. Asymmetric facet arthropathy. Right L5 radiculitis.
Transitional anatomy.

[Series 1: ortho standard · 1 of 1 slices shown (1 of 2)]
[im 1/1]
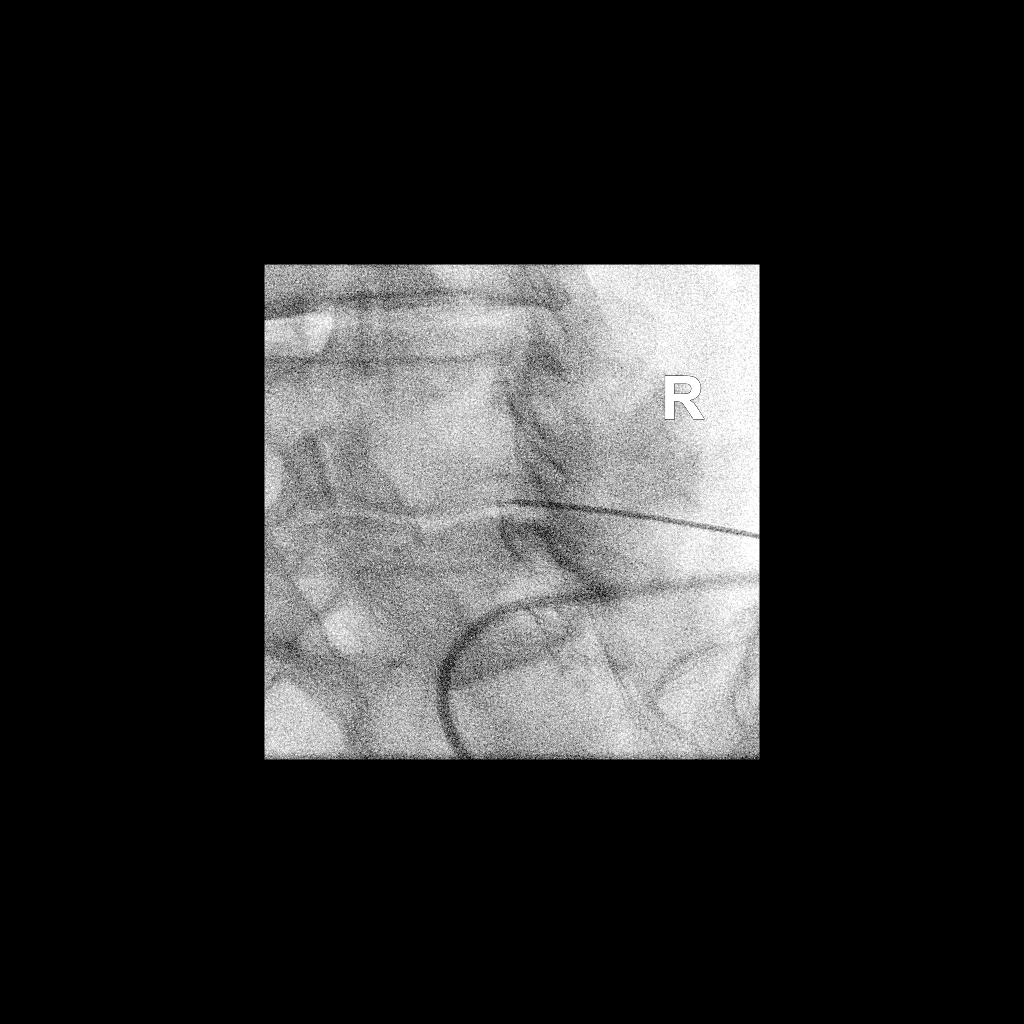

[Series 3: ortho standard · 1 of 1 slices shown (2 of 2)]
[im 1/1]
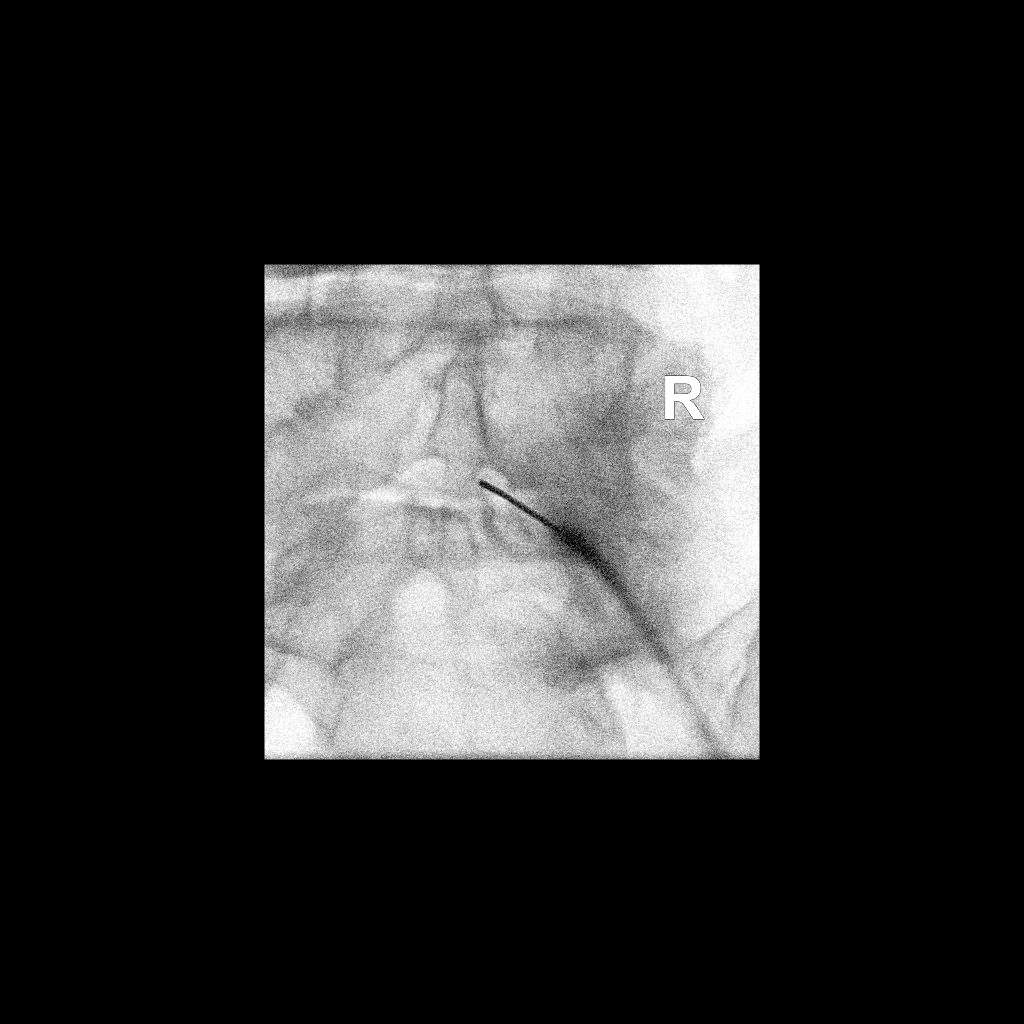

[2 of 2 positions shown; findings below may reference images not displayed]

FLUOROSCOPY TIME:  Radiation Exposure Index (as provided by the
fluoroscopic device): 57.01 uGy*m2

Fluoroscopy Time:  1 minutes 11 seconds

Number of Acquired Images:  0

PROCEDURE:
The procedure, risks, benefits, and alternatives were explained to
the patient. Questions regarding the procedure were encouraged and
answered. The patient understands and consents to the procedure.

Initial attempts were made at a a trans foraminal approach on the
right at L5-S1. I was unable to pass a 22 gauge spinal needle beyond
bone spurs into the foramen. I then converted to an interlaminar
approach.

LUMBAR EPIDURAL INJECTION:

An interlaminar approach was performed on right at L5-S1. The
overlying skin was cleansed and anesthetized. A 20 gauge epidural
needle was advanced using loss-of-resistance technique.

DIAGNOSTIC EPIDURAL INJECTION:

Injection of Isovue-M 200 shows a good epidural pattern with spread
above and below the level of needle placement, primarily on the
right no vascular opacification is seen.

THERAPEUTIC EPIDURAL INJECTION:

120 Mg of Depo-Medrol mixed with 1 mL 1% lidocaine were instilled.
The procedure was well-tolerated, and the patient was discharged
thirty minutes following the injection in good condition.

COMPLICATIONS:
None
IMPRESSION: Technically successful epidural injection on the right L5-S1 # 2

Unsuccessful attempt at a trans foraminal injection into a narrowed
osseous canal.

## 2018-12-05 ENCOUNTER — Other Ambulatory Visit: Payer: Self-pay

## 2018-12-05 NOTE — Patient Outreach (Signed)
[facility]) Care Management  12/05/2018  Billy Jacobson 01-14-1941 [id number]   Medication Adherence call to [name] Compliant Voice message left with a call back number. [name] is showing past due on Atorvastatin 80 mg under [place].  [place] Management Direct Dial [phone]  Fax [phone] [email or web address]

## 2018-12-31 ENCOUNTER — Other Ambulatory Visit: Payer: Self-pay

## 2018-12-31 NOTE — Patient Outreach (Signed)
[facility]) Care Management  12/31/2018  Billy Jacobson 1941/05/12 [id number]   Medication Adherence call to [name] Compliant Voice message left with a call back number. [name] is showing past due under [place].   [place] Management Direct Dial [phone]  Fax [phone] [email or web address]

## 2019-01-28 ENCOUNTER — Other Ambulatory Visit: Payer: Self-pay

## 2019-01-28 DIAGNOSIS — E7849 Other hyperlipidemia: Secondary | ICD-10-CM | POA: Diagnosis not present

## 2019-01-28 DIAGNOSIS — E1159 Type 2 diabetes mellitus with other circulatory complications: Secondary | ICD-10-CM | POA: Diagnosis not present

## 2019-01-28 DIAGNOSIS — Z6822 Body mass index (BMI) 22.0-22.9, adult: Secondary | ICD-10-CM | POA: Diagnosis not present

## 2019-01-28 DIAGNOSIS — Z0000 Encounter for general adult medical examination without abnormal findings: Secondary | ICD-10-CM | POA: Diagnosis not present

## 2019-01-28 DIAGNOSIS — I10 Essential (primary) hypertension: Secondary | ICD-10-CM | POA: Diagnosis not present

## 2019-01-28 NOTE — Patient Outreach (Signed)
[name]) Care Management  01/28/2019  Billy Jacobson 11-07-1940 [id number]   Medication Adherence call to [name] Compliant Voice message left with a call back number. [name] is showing past due on Ramipril 1.25 mg under [place].   [place] Management Direct Dial [phone]  Fax [phone] [email or web address]

## 2019-02-17 DIAGNOSIS — I259 Chronic ischemic heart disease, unspecified: Secondary | ICD-10-CM | POA: Diagnosis not present

## 2019-02-17 DIAGNOSIS — Z79899 Other long term (current) drug therapy: Secondary | ICD-10-CM | POA: Diagnosis not present

## 2019-02-17 DIAGNOSIS — I69828 Other speech and language deficits following other cerebrovascular disease: Secondary | ICD-10-CM | POA: Diagnosis not present

## 2019-02-17 DIAGNOSIS — E785 Hyperlipidemia, unspecified: Secondary | ICD-10-CM | POA: Diagnosis not present

## 2019-02-17 DIAGNOSIS — Z955 Presence of coronary angioplasty implant and graft: Secondary | ICD-10-CM | POA: Diagnosis not present

## 2019-02-17 DIAGNOSIS — I10 Essential (primary) hypertension: Secondary | ICD-10-CM | POA: Diagnosis not present

## 2019-02-17 DIAGNOSIS — G459 Transient cerebral ischemic attack, unspecified: Secondary | ICD-10-CM | POA: Diagnosis not present

## 2019-02-18 DIAGNOSIS — M545 Low back pain: Secondary | ICD-10-CM | POA: Diagnosis not present

## 2019-02-18 DIAGNOSIS — M47816 Spondylosis without myelopathy or radiculopathy, lumbar region: Secondary | ICD-10-CM | POA: Diagnosis not present

## 2019-02-18 DIAGNOSIS — M9903 Segmental and somatic dysfunction of lumbar region: Secondary | ICD-10-CM | POA: Diagnosis not present

## 2019-02-20 DIAGNOSIS — M545 Low back pain: Secondary | ICD-10-CM | POA: Diagnosis not present

## 2019-02-20 DIAGNOSIS — M9903 Segmental and somatic dysfunction of lumbar region: Secondary | ICD-10-CM | POA: Diagnosis not present

## 2019-02-20 DIAGNOSIS — M47816 Spondylosis without myelopathy or radiculopathy, lumbar region: Secondary | ICD-10-CM | POA: Diagnosis not present

## 2019-02-25 DIAGNOSIS — M9903 Segmental and somatic dysfunction of lumbar region: Secondary | ICD-10-CM | POA: Diagnosis not present

## 2019-02-25 DIAGNOSIS — M47816 Spondylosis without myelopathy or radiculopathy, lumbar region: Secondary | ICD-10-CM | POA: Diagnosis not present

## 2019-02-25 DIAGNOSIS — M545 Low back pain: Secondary | ICD-10-CM | POA: Diagnosis not present

## 2019-03-04 DIAGNOSIS — M47816 Spondylosis without myelopathy or radiculopathy, lumbar region: Secondary | ICD-10-CM | POA: Diagnosis not present

## 2019-03-04 DIAGNOSIS — M9903 Segmental and somatic dysfunction of lumbar region: Secondary | ICD-10-CM | POA: Diagnosis not present

## 2019-03-04 DIAGNOSIS — M545 Low back pain: Secondary | ICD-10-CM | POA: Diagnosis not present

## 2019-03-11 DIAGNOSIS — M545 Low back pain: Secondary | ICD-10-CM | POA: Diagnosis not present

## 2019-03-11 DIAGNOSIS — M9903 Segmental and somatic dysfunction of lumbar region: Secondary | ICD-10-CM | POA: Diagnosis not present

## 2019-03-11 DIAGNOSIS — M47816 Spondylosis without myelopathy or radiculopathy, lumbar region: Secondary | ICD-10-CM | POA: Diagnosis not present

## 2019-03-16 DIAGNOSIS — M47816 Spondylosis without myelopathy or radiculopathy, lumbar region: Secondary | ICD-10-CM | POA: Diagnosis not present

## 2019-03-16 DIAGNOSIS — M9903 Segmental and somatic dysfunction of lumbar region: Secondary | ICD-10-CM | POA: Diagnosis not present

## 2019-03-16 DIAGNOSIS — M545 Low back pain: Secondary | ICD-10-CM | POA: Diagnosis not present

## 2019-03-18 DIAGNOSIS — M9903 Segmental and somatic dysfunction of lumbar region: Secondary | ICD-10-CM | POA: Diagnosis not present

## 2019-03-18 DIAGNOSIS — M47816 Spondylosis without myelopathy or radiculopathy, lumbar region: Secondary | ICD-10-CM | POA: Diagnosis not present

## 2019-03-18 DIAGNOSIS — M545 Low back pain: Secondary | ICD-10-CM | POA: Diagnosis not present

## 2019-03-20 DIAGNOSIS — M545 Low back pain: Secondary | ICD-10-CM | POA: Diagnosis not present

## 2019-03-20 DIAGNOSIS — M47816 Spondylosis without myelopathy or radiculopathy, lumbar region: Secondary | ICD-10-CM | POA: Diagnosis not present

## 2019-03-20 DIAGNOSIS — M9903 Segmental and somatic dysfunction of lumbar region: Secondary | ICD-10-CM | POA: Diagnosis not present

## 2019-03-25 DIAGNOSIS — M47816 Spondylosis without myelopathy or radiculopathy, lumbar region: Secondary | ICD-10-CM | POA: Diagnosis not present

## 2019-03-25 DIAGNOSIS — M545 Low back pain: Secondary | ICD-10-CM | POA: Diagnosis not present

## 2019-03-25 DIAGNOSIS — M9903 Segmental and somatic dysfunction of lumbar region: Secondary | ICD-10-CM | POA: Diagnosis not present

## 2019-04-01 DIAGNOSIS — M9903 Segmental and somatic dysfunction of lumbar region: Secondary | ICD-10-CM | POA: Diagnosis not present

## 2019-04-01 DIAGNOSIS — M47816 Spondylosis without myelopathy or radiculopathy, lumbar region: Secondary | ICD-10-CM | POA: Diagnosis not present

## 2019-04-01 DIAGNOSIS — M545 Low back pain: Secondary | ICD-10-CM | POA: Diagnosis not present

## 2019-04-08 DIAGNOSIS — M47816 Spondylosis without myelopathy or radiculopathy, lumbar region: Secondary | ICD-10-CM | POA: Diagnosis not present

## 2019-04-08 DIAGNOSIS — M545 Low back pain: Secondary | ICD-10-CM | POA: Diagnosis not present

## 2019-04-08 DIAGNOSIS — M9903 Segmental and somatic dysfunction of lumbar region: Secondary | ICD-10-CM | POA: Diagnosis not present

## 2019-04-09 DIAGNOSIS — H2702 Aphakia, left eye: Secondary | ICD-10-CM | POA: Diagnosis not present

## 2019-04-09 DIAGNOSIS — H2511 Age-related nuclear cataract, right eye: Secondary | ICD-10-CM | POA: Diagnosis not present

## 2019-04-09 DIAGNOSIS — E119 Type 2 diabetes mellitus without complications: Secondary | ICD-10-CM | POA: Diagnosis not present

## 2019-04-15 DIAGNOSIS — M9903 Segmental and somatic dysfunction of lumbar region: Secondary | ICD-10-CM | POA: Diagnosis not present

## 2019-04-15 DIAGNOSIS — M545 Low back pain: Secondary | ICD-10-CM | POA: Diagnosis not present

## 2019-04-15 DIAGNOSIS — M47816 Spondylosis without myelopathy or radiculopathy, lumbar region: Secondary | ICD-10-CM | POA: Diagnosis not present

## 2019-04-22 DIAGNOSIS — M545 Low back pain: Secondary | ICD-10-CM | POA: Diagnosis not present

## 2019-04-22 DIAGNOSIS — M47816 Spondylosis without myelopathy or radiculopathy, lumbar region: Secondary | ICD-10-CM | POA: Diagnosis not present

## 2019-04-22 DIAGNOSIS — M9903 Segmental and somatic dysfunction of lumbar region: Secondary | ICD-10-CM | POA: Diagnosis not present

## 2019-04-29 DIAGNOSIS — M9903 Segmental and somatic dysfunction of lumbar region: Secondary | ICD-10-CM | POA: Diagnosis not present

## 2019-04-29 DIAGNOSIS — M47816 Spondylosis without myelopathy or radiculopathy, lumbar region: Secondary | ICD-10-CM | POA: Diagnosis not present

## 2019-04-29 DIAGNOSIS — M545 Low back pain: Secondary | ICD-10-CM | POA: Diagnosis not present

## 2019-05-13 DIAGNOSIS — M545 Low back pain: Secondary | ICD-10-CM | POA: Diagnosis not present

## 2019-05-13 DIAGNOSIS — M9903 Segmental and somatic dysfunction of lumbar region: Secondary | ICD-10-CM | POA: Diagnosis not present

## 2019-05-13 DIAGNOSIS — M47816 Spondylosis without myelopathy or radiculopathy, lumbar region: Secondary | ICD-10-CM | POA: Diagnosis not present

## 2019-05-20 DIAGNOSIS — M47816 Spondylosis without myelopathy or radiculopathy, lumbar region: Secondary | ICD-10-CM | POA: Diagnosis not present

## 2019-05-20 DIAGNOSIS — M545 Low back pain: Secondary | ICD-10-CM | POA: Diagnosis not present

## 2019-05-20 DIAGNOSIS — M9903 Segmental and somatic dysfunction of lumbar region: Secondary | ICD-10-CM | POA: Diagnosis not present

## 2019-05-27 DIAGNOSIS — M9901 Segmental and somatic dysfunction of cervical region: Secondary | ICD-10-CM | POA: Diagnosis not present

## 2019-05-27 DIAGNOSIS — S134XXA Sprain of ligaments of cervical spine, initial encounter: Secondary | ICD-10-CM | POA: Diagnosis not present

## 2019-05-27 DIAGNOSIS — M545 Low back pain: Secondary | ICD-10-CM | POA: Diagnosis not present

## 2019-05-27 DIAGNOSIS — M9902 Segmental and somatic dysfunction of thoracic region: Secondary | ICD-10-CM | POA: Diagnosis not present

## 2019-05-27 DIAGNOSIS — M47816 Spondylosis without myelopathy or radiculopathy, lumbar region: Secondary | ICD-10-CM | POA: Diagnosis not present

## 2019-05-27 DIAGNOSIS — M5134 Other intervertebral disc degeneration, thoracic region: Secondary | ICD-10-CM | POA: Diagnosis not present

## 2019-05-27 DIAGNOSIS — M9903 Segmental and somatic dysfunction of lumbar region: Secondary | ICD-10-CM | POA: Diagnosis not present

## 2019-06-10 DIAGNOSIS — M9903 Segmental and somatic dysfunction of lumbar region: Secondary | ICD-10-CM | POA: Diagnosis not present

## 2019-06-10 DIAGNOSIS — M47816 Spondylosis without myelopathy or radiculopathy, lumbar region: Secondary | ICD-10-CM | POA: Diagnosis not present

## 2019-06-10 DIAGNOSIS — M9901 Segmental and somatic dysfunction of cervical region: Secondary | ICD-10-CM | POA: Diagnosis not present

## 2019-06-10 DIAGNOSIS — M9902 Segmental and somatic dysfunction of thoracic region: Secondary | ICD-10-CM | POA: Diagnosis not present

## 2019-06-10 DIAGNOSIS — M5134 Other intervertebral disc degeneration, thoracic region: Secondary | ICD-10-CM | POA: Diagnosis not present

## 2019-06-10 DIAGNOSIS — S134XXA Sprain of ligaments of cervical spine, initial encounter: Secondary | ICD-10-CM | POA: Diagnosis not present

## 2019-06-10 DIAGNOSIS — M545 Low back pain: Secondary | ICD-10-CM | POA: Diagnosis not present

## 2019-06-15 DIAGNOSIS — M47816 Spondylosis without myelopathy or radiculopathy, lumbar region: Secondary | ICD-10-CM | POA: Diagnosis not present

## 2019-06-15 DIAGNOSIS — M9903 Segmental and somatic dysfunction of lumbar region: Secondary | ICD-10-CM | POA: Diagnosis not present

## 2019-06-15 DIAGNOSIS — S134XXA Sprain of ligaments of cervical spine, initial encounter: Secondary | ICD-10-CM | POA: Diagnosis not present

## 2019-06-15 DIAGNOSIS — M9902 Segmental and somatic dysfunction of thoracic region: Secondary | ICD-10-CM | POA: Diagnosis not present

## 2019-06-15 DIAGNOSIS — M5134 Other intervertebral disc degeneration, thoracic region: Secondary | ICD-10-CM | POA: Diagnosis not present

## 2019-06-15 DIAGNOSIS — M9901 Segmental and somatic dysfunction of cervical region: Secondary | ICD-10-CM | POA: Diagnosis not present

## 2019-06-15 DIAGNOSIS — M545 Low back pain: Secondary | ICD-10-CM | POA: Diagnosis not present

## 2019-06-17 DIAGNOSIS — M47816 Spondylosis without myelopathy or radiculopathy, lumbar region: Secondary | ICD-10-CM | POA: Diagnosis not present

## 2019-06-17 DIAGNOSIS — M545 Low back pain: Secondary | ICD-10-CM | POA: Diagnosis not present

## 2019-06-17 DIAGNOSIS — M9903 Segmental and somatic dysfunction of lumbar region: Secondary | ICD-10-CM | POA: Diagnosis not present

## 2019-06-24 DIAGNOSIS — M47816 Spondylosis without myelopathy or radiculopathy, lumbar region: Secondary | ICD-10-CM | POA: Diagnosis not present

## 2019-06-24 DIAGNOSIS — M545 Low back pain: Secondary | ICD-10-CM | POA: Diagnosis not present

## 2019-06-24 DIAGNOSIS — M9903 Segmental and somatic dysfunction of lumbar region: Secondary | ICD-10-CM | POA: Diagnosis not present

## 2019-06-30 DIAGNOSIS — D229 Melanocytic nevi, unspecified: Secondary | ICD-10-CM | POA: Diagnosis not present

## 2019-06-30 DIAGNOSIS — D692 Other nonthrombocytopenic purpura: Secondary | ICD-10-CM | POA: Diagnosis not present

## 2019-06-30 DIAGNOSIS — L729 Follicular cyst of the skin and subcutaneous tissue, unspecified: Secondary | ICD-10-CM | POA: Diagnosis not present

## 2019-07-01 DIAGNOSIS — M9903 Segmental and somatic dysfunction of lumbar region: Secondary | ICD-10-CM | POA: Diagnosis not present

## 2019-07-01 DIAGNOSIS — M545 Low back pain: Secondary | ICD-10-CM | POA: Diagnosis not present

## 2019-07-01 DIAGNOSIS — M47816 Spondylosis without myelopathy or radiculopathy, lumbar region: Secondary | ICD-10-CM | POA: Diagnosis not present

## 2019-07-15 DIAGNOSIS — M47816 Spondylosis without myelopathy or radiculopathy, lumbar region: Secondary | ICD-10-CM | POA: Diagnosis not present

## 2019-07-15 DIAGNOSIS — M545 Low back pain: Secondary | ICD-10-CM | POA: Diagnosis not present

## 2019-07-15 DIAGNOSIS — M9903 Segmental and somatic dysfunction of lumbar region: Secondary | ICD-10-CM | POA: Diagnosis not present

## 2019-07-29 DIAGNOSIS — M545 Low back pain: Secondary | ICD-10-CM | POA: Diagnosis not present

## 2019-07-29 DIAGNOSIS — M47816 Spondylosis without myelopathy or radiculopathy, lumbar region: Secondary | ICD-10-CM | POA: Diagnosis not present

## 2019-07-29 DIAGNOSIS — M9903 Segmental and somatic dysfunction of lumbar region: Secondary | ICD-10-CM | POA: Diagnosis not present

## 2019-08-03 DIAGNOSIS — I10 Essential (primary) hypertension: Secondary | ICD-10-CM | POA: Diagnosis not present

## 2019-08-03 DIAGNOSIS — Z0000 Encounter for general adult medical examination without abnormal findings: Secondary | ICD-10-CM | POA: Diagnosis not present

## 2019-08-03 DIAGNOSIS — E7849 Other hyperlipidemia: Secondary | ICD-10-CM | POA: Diagnosis not present

## 2019-08-03 DIAGNOSIS — E1159 Type 2 diabetes mellitus with other circulatory complications: Secondary | ICD-10-CM | POA: Diagnosis not present

## 2019-08-03 DIAGNOSIS — Z6822 Body mass index (BMI) 22.0-22.9, adult: Secondary | ICD-10-CM | POA: Diagnosis not present

## 2019-08-31 DIAGNOSIS — Z23 Encounter for immunization: Secondary | ICD-10-CM | POA: Diagnosis not present

## 2019-09-18 DIAGNOSIS — Z23 Encounter for immunization: Secondary | ICD-10-CM | POA: Diagnosis not present

## 2019-09-23 ENCOUNTER — Other Ambulatory Visit: Payer: Self-pay

## 2019-09-23 ENCOUNTER — Encounter: Payer: Self-pay | Admitting: Dermatology

## 2019-09-23 ENCOUNTER — Telehealth: Payer: Self-pay | Admitting: Dermatology

## 2019-09-23 ENCOUNTER — Ambulatory Visit: Payer: Medicare Other | Admitting: Dermatology

## 2019-09-23 DIAGNOSIS — D225 Melanocytic nevi of trunk: Secondary | ICD-10-CM | POA: Diagnosis not present

## 2019-09-23 DIAGNOSIS — D229 Melanocytic nevi, unspecified: Secondary | ICD-10-CM

## 2019-09-23 DIAGNOSIS — D485 Neoplasm of uncertain behavior of skin: Secondary | ICD-10-CM

## 2019-09-23 DIAGNOSIS — D224 Melanocytic nevi of scalp and neck: Secondary | ICD-10-CM | POA: Diagnosis not present

## 2019-09-23 DIAGNOSIS — C44529 Squamous cell carcinoma of skin of other part of trunk: Secondary | ICD-10-CM | POA: Diagnosis not present

## 2019-09-23 DIAGNOSIS — C4492 Squamous cell carcinoma of skin, unspecified: Secondary | ICD-10-CM

## 2019-09-23 DIAGNOSIS — D492 Neoplasm of unspecified behavior of bone, soft tissue, and skin: Secondary | ICD-10-CM

## 2019-09-23 HISTORY — DX: Squamous cell carcinoma of skin, unspecified: C44.92

## 2019-09-23 NOTE — Telephone Encounter (Signed)
Called patient to let him know that his discharge instructions were incorrect and that Dr tafeen fixed the notes in [place].. spoke with his wife and told him to call back with any questions.

## 2019-09-23 NOTE — Telephone Encounter (Signed)
Patient was seen today and is calling back because his after visit summary is showing information that is incorrect about his visit today, specifically the location of biopsy site. Chart 12555

## 2019-09-24 ENCOUNTER — Encounter: Payer: Self-pay | Admitting: Dermatology

## 2019-09-24 NOTE — Progress Notes (Addendum)
   Follow-Up Visit   [place] is a 79 y.o. male who presents for the following: Skin Problem (left collar bone for months raised not but bleeding ).  growth Location: Left inner collarbone Duration: Months Quality: Increased size Associated Signs/Symptoms: Stings Modifying Factors:  Severity:  Timing: Context:   The following portions of the chart were reviewed this encounter and updated as appropriate: Tobacco  Allergies  Meds  Problems  Med Hx  Surg Hx  Fam Hx      Objective  Well appearing patient in no apparent distress; mood and affect are within normal limits.  All skin waist up examined.   Assessment & Plan  Neoplasm of skin Left Supraclavicular Area  Skin / nail biopsy Type of biopsy: tangential   Informed consent: discussed and consent obtained   Timeout: patient name, date of birth, surgical site, and procedure verified   Procedure prep:  Patient was prepped and draped in usual sterile fashion Prep type:  Chlorhexidine Anesthesia: the lesion was anesthetized in a standard fashion   Anesthetic:  1% lidocaine w/ epinephrine 1-100,000 local infiltration Instrument used: flexible razor blade   Hemostasis achieved with: ferric subsulfate   Outcome: patient tolerated procedure well   Post-procedure details: sterile dressing applied and wound care instructions given   Dressing type: petrolatum   Additional details:  Patient identified lesion of concern.  Lesion identified by physician.  Nevus (3) Mid Back; Left Anterior Neck; Left Supraclavicular Area   Other Procedures Placed This Encounter Surgical pathology

## 2019-09-28 ENCOUNTER — Telehealth: Payer: Self-pay

## 2019-09-28 ENCOUNTER — Telehealth: Payer: Self-pay | Admitting: Dermatology

## 2019-09-28 NOTE — Telephone Encounter (Signed)
Patient calling for Bx results. Please give results to patients wife Nell if he is not available.

## 2019-09-28 NOTE — Telephone Encounter (Signed)
Pathology given to patient and Surgery visit made with Dr Denna Haggard 10/22/19

## 2019-10-22 ENCOUNTER — Ambulatory Visit (INDEPENDENT_AMBULATORY_CARE_PROVIDER_SITE_OTHER): Payer: Medicare Other | Admitting: Dermatology

## 2019-10-22 ENCOUNTER — Other Ambulatory Visit: Payer: Self-pay

## 2019-10-22 ENCOUNTER — Encounter: Payer: Self-pay | Admitting: Dermatology

## 2019-10-22 DIAGNOSIS — C4442 Squamous cell carcinoma of skin of scalp and neck: Secondary | ICD-10-CM | POA: Diagnosis not present

## 2019-10-22 DIAGNOSIS — C4492 Squamous cell carcinoma of skin, unspecified: Secondary | ICD-10-CM

## 2019-10-22 NOTE — Patient Instructions (Signed)

## 2019-10-22 NOTE — Progress Notes (Addendum)
   Follow-Up Visit   Billy Jacobson is a 79 y.o. male who presents for the following: Skin Cancer (left supraclavicular area-scc x 1).  SCCA Location:  Duration:  Quality:  Associated Signs/Symptoms: Modifying Factors:  Severity:  Timing: Context:   The following portions of the chart were reviewed this encounter and updated as appropriate: Tobacco  Allergies  Meds  Problems  Med Hx  Surg Hx  Fam Hx      Objective  Well appearing patient in no apparent distress; mood and affect are within normal limits.  A focused examination was performed including head, neck, torso. Relevant physical exam findings are noted in the Assessment and Plan.   Assessment & Plan  Squamous cell carcinoma of skin Neck - Anterior  Skin excision  Destruction of lesion Complexity: simple   Destruction method: electrodesiccation and curettage   Informed consent: discussed and consent obtained   Timeout:  patient name, date of birth, surgical site, and procedure verified Anesthesia: the lesion was anesthetized in a standard fashion   Anesthetic:  1% lidocaine w/ epinephrine 1-100,000 local infiltration Curettage performed in three different directions: Yes   Curettage cycles:  3 Lesion length (cm):  1.4 Lesion width (cm):  1 Margin per side (cm):  0 Final wound size (cm):  1.4 Hemostasis achieved with:  ferric subsulfate Outcome: patient tolerated procedure well with no complications   Post-procedure details: wound care instructions given   Additional details:  Curet x3, wide>deep, base inoculated with parenteral 5FU. 1.4CM

## 2019-10-25 ENCOUNTER — Encounter: Payer: Self-pay | Admitting: Dermatology

## 2019-11-02 DIAGNOSIS — M810 Age-related osteoporosis without current pathological fracture: Secondary | ICD-10-CM | POA: Diagnosis not present

## 2019-11-02 DIAGNOSIS — Z0389 Encounter for observation for other suspected diseases and conditions ruled out: Secondary | ICD-10-CM | POA: Diagnosis not present

## 2019-12-17 DIAGNOSIS — M5137 Other intervertebral disc degeneration, lumbosacral region: Secondary | ICD-10-CM | POA: Diagnosis not present

## 2019-12-17 DIAGNOSIS — M47817 Spondylosis without myelopathy or radiculopathy, lumbosacral region: Secondary | ICD-10-CM | POA: Diagnosis not present

## 2019-12-17 DIAGNOSIS — M5136 Other intervertebral disc degeneration, lumbar region: Secondary | ICD-10-CM | POA: Diagnosis not present

## 2019-12-17 DIAGNOSIS — M48061 Spinal stenosis, lumbar region without neurogenic claudication: Secondary | ICD-10-CM | POA: Diagnosis not present

## 2019-12-17 DIAGNOSIS — M7918 Myalgia, other site: Secondary | ICD-10-CM | POA: Diagnosis not present

## 2019-12-17 DIAGNOSIS — M4726 Other spondylosis with radiculopathy, lumbar region: Secondary | ICD-10-CM | POA: Diagnosis not present

## 2019-12-17 DIAGNOSIS — M545 Low back pain: Secondary | ICD-10-CM | POA: Diagnosis not present

## 2020-01-05 DIAGNOSIS — M4726 Other spondylosis with radiculopathy, lumbar region: Secondary | ICD-10-CM | POA: Diagnosis not present

## 2020-01-05 DIAGNOSIS — M5136 Other intervertebral disc degeneration, lumbar region: Secondary | ICD-10-CM | POA: Diagnosis not present

## 2020-02-08 DIAGNOSIS — E1159 Type 2 diabetes mellitus with other circulatory complications: Secondary | ICD-10-CM | POA: Diagnosis not present

## 2020-02-08 DIAGNOSIS — I10 Essential (primary) hypertension: Secondary | ICD-10-CM | POA: Diagnosis not present

## 2020-02-08 DIAGNOSIS — Z0000 Encounter for general adult medical examination without abnormal findings: Secondary | ICD-10-CM | POA: Diagnosis not present

## 2020-02-08 DIAGNOSIS — Z6820 Body mass index (BMI) 20.0-20.9, adult: Secondary | ICD-10-CM | POA: Diagnosis not present

## 2020-02-08 DIAGNOSIS — E7849 Other hyperlipidemia: Secondary | ICD-10-CM | POA: Diagnosis not present

## 2020-02-19 DIAGNOSIS — W010XXA Fall on same level from slipping, tripping and stumbling without subsequent striking against object, initial encounter: Secondary | ICD-10-CM | POA: Diagnosis not present

## 2020-02-19 DIAGNOSIS — M5136 Other intervertebral disc degeneration, lumbar region: Secondary | ICD-10-CM | POA: Diagnosis not present

## 2020-02-19 DIAGNOSIS — M7918 Myalgia, other site: Secondary | ICD-10-CM | POA: Diagnosis not present

## 2020-02-19 DIAGNOSIS — M5416 Radiculopathy, lumbar region: Secondary | ICD-10-CM | POA: Diagnosis not present

## 2020-02-19 DIAGNOSIS — M4807 Spinal stenosis, lumbosacral region: Secondary | ICD-10-CM | POA: Diagnosis not present

## 2020-02-19 DIAGNOSIS — M47816 Spondylosis without myelopathy or radiculopathy, lumbar region: Secondary | ICD-10-CM | POA: Diagnosis not present

## 2020-03-14 DIAGNOSIS — M48061 Spinal stenosis, lumbar region without neurogenic claudication: Secondary | ICD-10-CM | POA: Diagnosis not present

## 2020-03-14 DIAGNOSIS — S22089G Unspecified fracture of T11-T12 vertebra, subsequent encounter for fracture with delayed healing: Secondary | ICD-10-CM | POA: Diagnosis not present

## 2020-03-14 DIAGNOSIS — M5136 Other intervertebral disc degeneration, lumbar region: Secondary | ICD-10-CM | POA: Diagnosis not present

## 2020-03-14 DIAGNOSIS — M5127 Other intervertebral disc displacement, lumbosacral region: Secondary | ICD-10-CM | POA: Diagnosis not present

## 2020-03-14 DIAGNOSIS — M810 Age-related osteoporosis without current pathological fracture: Secondary | ICD-10-CM | POA: Diagnosis not present

## 2020-03-14 DIAGNOSIS — M47816 Spondylosis without myelopathy or radiculopathy, lumbar region: Secondary | ICD-10-CM | POA: Diagnosis not present

## 2020-03-28 DIAGNOSIS — Z01812 Encounter for preprocedural laboratory examination: Secondary | ICD-10-CM | POA: Diagnosis not present

## 2020-03-28 DIAGNOSIS — S22080A Wedge compression fracture of T11-T12 vertebra, initial encounter for closed fracture: Secondary | ICD-10-CM | POA: Diagnosis not present

## 2020-03-28 DIAGNOSIS — I2510 Atherosclerotic heart disease of native coronary artery without angina pectoris: Secondary | ICD-10-CM | POA: Diagnosis not present

## 2020-03-28 DIAGNOSIS — I10 Essential (primary) hypertension: Secondary | ICD-10-CM | POA: Diagnosis not present

## 2020-03-28 DIAGNOSIS — Z951 Presence of aortocoronary bypass graft: Secondary | ICD-10-CM | POA: Diagnosis not present

## 2020-04-15 DIAGNOSIS — Z7984 Long term (current) use of oral hypoglycemic drugs: Secondary | ICD-10-CM | POA: Diagnosis not present

## 2020-04-15 DIAGNOSIS — S22088A Other fracture of T11-T12 vertebra, initial encounter for closed fracture: Secondary | ICD-10-CM | POA: Diagnosis not present

## 2020-04-15 DIAGNOSIS — I252 Old myocardial infarction: Secondary | ICD-10-CM | POA: Diagnosis not present

## 2020-04-15 DIAGNOSIS — M1990 Unspecified osteoarthritis, unspecified site: Secondary | ICD-10-CM | POA: Diagnosis not present

## 2020-04-15 DIAGNOSIS — I10 Essential (primary) hypertension: Secondary | ICD-10-CM | POA: Diagnosis not present

## 2020-04-15 DIAGNOSIS — Z87891 Personal history of nicotine dependence: Secondary | ICD-10-CM | POA: Diagnosis not present

## 2020-04-15 DIAGNOSIS — S22080A Wedge compression fracture of T11-T12 vertebra, initial encounter for closed fracture: Secondary | ICD-10-CM | POA: Diagnosis not present

## 2020-04-15 DIAGNOSIS — M8008XA Age-related osteoporosis with current pathological fracture, vertebra(e), initial encounter for fracture: Secondary | ICD-10-CM | POA: Diagnosis not present

## 2020-04-15 DIAGNOSIS — Z8673 Personal history of transient ischemic attack (TIA), and cerebral infarction without residual deficits: Secondary | ICD-10-CM | POA: Diagnosis not present

## 2020-04-15 DIAGNOSIS — Z955 Presence of coronary angioplasty implant and graft: Secondary | ICD-10-CM | POA: Diagnosis not present

## 2020-04-15 DIAGNOSIS — E119 Type 2 diabetes mellitus without complications: Secondary | ICD-10-CM | POA: Diagnosis not present

## 2020-04-15 DIAGNOSIS — S22089A Unspecified fracture of T11-T12 vertebra, initial encounter for closed fracture: Secondary | ICD-10-CM | POA: Diagnosis not present

## 2020-04-15 DIAGNOSIS — Z9109 Other allergy status, other than to drugs and biological substances: Secondary | ICD-10-CM | POA: Diagnosis not present

## 2020-04-15 DIAGNOSIS — E785 Hyperlipidemia, unspecified: Secondary | ICD-10-CM | POA: Diagnosis not present

## 2020-04-15 DIAGNOSIS — I2510 Atherosclerotic heart disease of native coronary artery without angina pectoris: Secondary | ICD-10-CM | POA: Diagnosis not present

## 2020-04-15 DIAGNOSIS — Z79899 Other long term (current) drug therapy: Secondary | ICD-10-CM | POA: Diagnosis not present

## 2020-04-15 DIAGNOSIS — Z7982 Long term (current) use of aspirin: Secondary | ICD-10-CM | POA: Diagnosis not present

## 2020-04-15 DIAGNOSIS — Z7902 Long term (current) use of antithrombotics/antiplatelets: Secondary | ICD-10-CM | POA: Diagnosis not present

## 2020-04-26 DIAGNOSIS — M8000XG Age-related osteoporosis with current pathological fracture, unspecified site, subsequent encounter for fracture with delayed healing: Secondary | ICD-10-CM | POA: Diagnosis not present

## 2020-04-26 DIAGNOSIS — S22080G Wedge compression fracture of T11-T12 vertebra, subsequent encounter for fracture with delayed healing: Secondary | ICD-10-CM | POA: Diagnosis not present

## 2020-04-26 DIAGNOSIS — R1031 Right lower quadrant pain: Secondary | ICD-10-CM | POA: Diagnosis not present

## 2020-05-18 DIAGNOSIS — M25551 Pain in right hip: Secondary | ICD-10-CM | POA: Diagnosis not present

## 2020-05-24 DIAGNOSIS — Z7902 Long term (current) use of antithrombotics/antiplatelets: Secondary | ICD-10-CM | POA: Diagnosis not present

## 2020-05-24 DIAGNOSIS — I259 Chronic ischemic heart disease, unspecified: Secondary | ICD-10-CM | POA: Diagnosis not present

## 2020-05-24 DIAGNOSIS — I252 Old myocardial infarction: Secondary | ICD-10-CM | POA: Diagnosis not present

## 2020-05-24 DIAGNOSIS — I10 Essential (primary) hypertension: Secondary | ICD-10-CM | POA: Diagnosis not present

## 2020-05-24 DIAGNOSIS — Z8673 Personal history of transient ischemic attack (TIA), and cerebral infarction without residual deficits: Secondary | ICD-10-CM | POA: Diagnosis not present

## 2020-05-24 DIAGNOSIS — Z7982 Long term (current) use of aspirin: Secondary | ICD-10-CM | POA: Diagnosis not present

## 2020-05-24 DIAGNOSIS — E785 Hyperlipidemia, unspecified: Secondary | ICD-10-CM | POA: Diagnosis not present

## 2020-05-24 DIAGNOSIS — Z955 Presence of coronary angioplasty implant and graft: Secondary | ICD-10-CM | POA: Diagnosis not present

## 2020-05-24 DIAGNOSIS — I2510 Atherosclerotic heart disease of native coronary artery without angina pectoris: Secondary | ICD-10-CM | POA: Diagnosis not present

## 2020-05-24 DIAGNOSIS — Z79899 Other long term (current) drug therapy: Secondary | ICD-10-CM | POA: Diagnosis not present

## 2020-05-24 DIAGNOSIS — Z87891 Personal history of nicotine dependence: Secondary | ICD-10-CM | POA: Diagnosis not present

## 2020-05-26 DIAGNOSIS — M25551 Pain in right hip: Secondary | ICD-10-CM | POA: Diagnosis not present

## 2020-05-27 DIAGNOSIS — M25551 Pain in right hip: Secondary | ICD-10-CM | POA: Diagnosis not present

## 2020-05-31 DIAGNOSIS — M25551 Pain in right hip: Secondary | ICD-10-CM | POA: Diagnosis not present

## 2020-06-02 DIAGNOSIS — M25551 Pain in right hip: Secondary | ICD-10-CM | POA: Diagnosis not present

## 2020-06-07 DIAGNOSIS — M4807 Spinal stenosis, lumbosacral region: Secondary | ICD-10-CM | POA: Diagnosis not present

## 2020-06-07 DIAGNOSIS — Z9889 Other specified postprocedural states: Secondary | ICD-10-CM | POA: Diagnosis not present

## 2020-06-07 DIAGNOSIS — M5136 Other intervertebral disc degeneration, lumbar region: Secondary | ICD-10-CM | POA: Diagnosis not present

## 2020-06-07 DIAGNOSIS — M5416 Radiculopathy, lumbar region: Secondary | ICD-10-CM | POA: Diagnosis not present

## 2020-06-09 DIAGNOSIS — M25551 Pain in right hip: Secondary | ICD-10-CM | POA: Diagnosis not present

## 2020-06-14 DIAGNOSIS — M25551 Pain in right hip: Secondary | ICD-10-CM | POA: Diagnosis not present

## 2020-06-16 DIAGNOSIS — M25551 Pain in right hip: Secondary | ICD-10-CM | POA: Diagnosis not present

## 2020-06-21 DIAGNOSIS — M25551 Pain in right hip: Secondary | ICD-10-CM | POA: Diagnosis not present

## 2020-06-23 DIAGNOSIS — M25551 Pain in right hip: Secondary | ICD-10-CM | POA: Diagnosis not present

## 2020-06-28 DIAGNOSIS — M25551 Pain in right hip: Secondary | ICD-10-CM | POA: Diagnosis not present

## 2020-06-30 DIAGNOSIS — M25551 Pain in right hip: Secondary | ICD-10-CM | POA: Diagnosis not present

## 2020-07-01 DIAGNOSIS — M48061 Spinal stenosis, lumbar region without neurogenic claudication: Secondary | ICD-10-CM | POA: Diagnosis not present

## 2020-07-01 DIAGNOSIS — M5136 Other intervertebral disc degeneration, lumbar region: Secondary | ICD-10-CM | POA: Diagnosis not present

## 2020-07-01 DIAGNOSIS — M4726 Other spondylosis with radiculopathy, lumbar region: Secondary | ICD-10-CM | POA: Diagnosis not present

## 2020-08-10 DIAGNOSIS — Z0000 Encounter for general adult medical examination without abnormal findings: Secondary | ICD-10-CM | POA: Diagnosis not present

## 2020-08-10 DIAGNOSIS — M5431 Sciatica, right side: Secondary | ICD-10-CM | POA: Diagnosis not present

## 2020-08-10 DIAGNOSIS — I10 Essential (primary) hypertension: Secondary | ICD-10-CM | POA: Diagnosis not present

## 2020-08-10 DIAGNOSIS — E7849 Other hyperlipidemia: Secondary | ICD-10-CM | POA: Diagnosis not present

## 2020-08-10 DIAGNOSIS — E1159 Type 2 diabetes mellitus with other circulatory complications: Secondary | ICD-10-CM | POA: Diagnosis not present

## 2020-08-10 DIAGNOSIS — Z6820 Body mass index (BMI) 20.0-20.9, adult: Secondary | ICD-10-CM | POA: Diagnosis not present

## 2020-08-11 DIAGNOSIS — I10 Essential (primary) hypertension: Secondary | ICD-10-CM | POA: Diagnosis not present

## 2020-08-11 DIAGNOSIS — E7849 Other hyperlipidemia: Secondary | ICD-10-CM | POA: Diagnosis not present

## 2020-08-11 DIAGNOSIS — Z0000 Encounter for general adult medical examination without abnormal findings: Secondary | ICD-10-CM | POA: Diagnosis not present

## 2020-08-11 DIAGNOSIS — M5431 Sciatica, right side: Secondary | ICD-10-CM | POA: Diagnosis not present

## 2020-08-11 DIAGNOSIS — E1159 Type 2 diabetes mellitus with other circulatory complications: Secondary | ICD-10-CM | POA: Diagnosis not present

## 2020-08-15 DIAGNOSIS — S233XXA Sprain of ligaments of thoracic spine, initial encounter: Secondary | ICD-10-CM | POA: Diagnosis not present

## 2020-08-15 DIAGNOSIS — M9903 Segmental and somatic dysfunction of lumbar region: Secondary | ICD-10-CM | POA: Diagnosis not present

## 2020-08-15 DIAGNOSIS — M9902 Segmental and somatic dysfunction of thoracic region: Secondary | ICD-10-CM | POA: Diagnosis not present

## 2020-08-15 DIAGNOSIS — M47816 Spondylosis without myelopathy or radiculopathy, lumbar region: Secondary | ICD-10-CM | POA: Diagnosis not present

## 2020-08-15 DIAGNOSIS — M9901 Segmental and somatic dysfunction of cervical region: Secondary | ICD-10-CM | POA: Diagnosis not present

## 2020-08-15 DIAGNOSIS — M47812 Spondylosis without myelopathy or radiculopathy, cervical region: Secondary | ICD-10-CM | POA: Diagnosis not present

## 2020-08-15 DIAGNOSIS — M545 Low back pain, unspecified: Secondary | ICD-10-CM | POA: Diagnosis not present

## 2020-08-17 DIAGNOSIS — M9902 Segmental and somatic dysfunction of thoracic region: Secondary | ICD-10-CM | POA: Diagnosis not present

## 2020-08-17 DIAGNOSIS — S233XXA Sprain of ligaments of thoracic spine, initial encounter: Secondary | ICD-10-CM | POA: Diagnosis not present

## 2020-08-17 DIAGNOSIS — M9903 Segmental and somatic dysfunction of lumbar region: Secondary | ICD-10-CM | POA: Diagnosis not present

## 2020-08-17 DIAGNOSIS — M47812 Spondylosis without myelopathy or radiculopathy, cervical region: Secondary | ICD-10-CM | POA: Diagnosis not present

## 2020-08-17 DIAGNOSIS — M9901 Segmental and somatic dysfunction of cervical region: Secondary | ICD-10-CM | POA: Diagnosis not present

## 2020-08-19 DIAGNOSIS — S233XXA Sprain of ligaments of thoracic spine, initial encounter: Secondary | ICD-10-CM | POA: Diagnosis not present

## 2020-08-19 DIAGNOSIS — M9902 Segmental and somatic dysfunction of thoracic region: Secondary | ICD-10-CM | POA: Diagnosis not present

## 2020-08-19 DIAGNOSIS — M9903 Segmental and somatic dysfunction of lumbar region: Secondary | ICD-10-CM | POA: Diagnosis not present

## 2020-08-19 DIAGNOSIS — M9901 Segmental and somatic dysfunction of cervical region: Secondary | ICD-10-CM | POA: Diagnosis not present

## 2020-08-19 DIAGNOSIS — M47812 Spondylosis without myelopathy or radiculopathy, cervical region: Secondary | ICD-10-CM | POA: Diagnosis not present

## 2020-08-19 DIAGNOSIS — M47816 Spondylosis without myelopathy or radiculopathy, lumbar region: Secondary | ICD-10-CM | POA: Diagnosis not present

## 2020-08-19 DIAGNOSIS — M545 Low back pain, unspecified: Secondary | ICD-10-CM | POA: Diagnosis not present

## 2020-08-23 DIAGNOSIS — M9903 Segmental and somatic dysfunction of lumbar region: Secondary | ICD-10-CM | POA: Diagnosis not present

## 2020-08-23 DIAGNOSIS — M47812 Spondylosis without myelopathy or radiculopathy, cervical region: Secondary | ICD-10-CM | POA: Diagnosis not present

## 2020-08-23 DIAGNOSIS — S233XXA Sprain of ligaments of thoracic spine, initial encounter: Secondary | ICD-10-CM | POA: Diagnosis not present

## 2020-08-23 DIAGNOSIS — M9901 Segmental and somatic dysfunction of cervical region: Secondary | ICD-10-CM | POA: Diagnosis not present

## 2020-08-23 DIAGNOSIS — M9902 Segmental and somatic dysfunction of thoracic region: Secondary | ICD-10-CM | POA: Diagnosis not present

## 2020-08-24 DIAGNOSIS — H2702 Aphakia, left eye: Secondary | ICD-10-CM | POA: Diagnosis not present

## 2020-08-24 DIAGNOSIS — H40053 Ocular hypertension, bilateral: Secondary | ICD-10-CM | POA: Diagnosis not present

## 2020-08-24 DIAGNOSIS — H2511 Age-related nuclear cataract, right eye: Secondary | ICD-10-CM | POA: Diagnosis not present

## 2020-08-24 DIAGNOSIS — E119 Type 2 diabetes mellitus without complications: Secondary | ICD-10-CM | POA: Diagnosis not present

## 2020-08-25 DIAGNOSIS — M47812 Spondylosis without myelopathy or radiculopathy, cervical region: Secondary | ICD-10-CM | POA: Diagnosis not present

## 2020-08-25 DIAGNOSIS — M9903 Segmental and somatic dysfunction of lumbar region: Secondary | ICD-10-CM | POA: Diagnosis not present

## 2020-08-25 DIAGNOSIS — M9902 Segmental and somatic dysfunction of thoracic region: Secondary | ICD-10-CM | POA: Diagnosis not present

## 2020-08-25 DIAGNOSIS — M9901 Segmental and somatic dysfunction of cervical region: Secondary | ICD-10-CM | POA: Diagnosis not present

## 2020-08-25 DIAGNOSIS — S233XXA Sprain of ligaments of thoracic spine, initial encounter: Secondary | ICD-10-CM | POA: Diagnosis not present

## 2020-08-30 DIAGNOSIS — M9902 Segmental and somatic dysfunction of thoracic region: Secondary | ICD-10-CM | POA: Diagnosis not present

## 2020-08-30 DIAGNOSIS — S233XXA Sprain of ligaments of thoracic spine, initial encounter: Secondary | ICD-10-CM | POA: Diagnosis not present

## 2020-08-30 DIAGNOSIS — M9903 Segmental and somatic dysfunction of lumbar region: Secondary | ICD-10-CM | POA: Diagnosis not present

## 2020-08-30 DIAGNOSIS — M47812 Spondylosis without myelopathy or radiculopathy, cervical region: Secondary | ICD-10-CM | POA: Diagnosis not present

## 2020-08-30 DIAGNOSIS — M545 Low back pain, unspecified: Secondary | ICD-10-CM | POA: Diagnosis not present

## 2020-08-30 DIAGNOSIS — M9901 Segmental and somatic dysfunction of cervical region: Secondary | ICD-10-CM | POA: Diagnosis not present

## 2020-08-30 DIAGNOSIS — M47816 Spondylosis without myelopathy or radiculopathy, lumbar region: Secondary | ICD-10-CM | POA: Diagnosis not present

## 2020-09-06 DIAGNOSIS — M545 Low back pain, unspecified: Secondary | ICD-10-CM | POA: Diagnosis not present

## 2020-09-06 DIAGNOSIS — M9902 Segmental and somatic dysfunction of thoracic region: Secondary | ICD-10-CM | POA: Diagnosis not present

## 2020-09-06 DIAGNOSIS — M9903 Segmental and somatic dysfunction of lumbar region: Secondary | ICD-10-CM | POA: Diagnosis not present

## 2020-09-06 DIAGNOSIS — M47816 Spondylosis without myelopathy or radiculopathy, lumbar region: Secondary | ICD-10-CM | POA: Diagnosis not present

## 2020-09-06 DIAGNOSIS — M9901 Segmental and somatic dysfunction of cervical region: Secondary | ICD-10-CM | POA: Diagnosis not present

## 2020-09-06 DIAGNOSIS — S233XXA Sprain of ligaments of thoracic spine, initial encounter: Secondary | ICD-10-CM | POA: Diagnosis not present

## 2020-09-06 DIAGNOSIS — M47812 Spondylosis without myelopathy or radiculopathy, cervical region: Secondary | ICD-10-CM | POA: Diagnosis not present

## 2020-09-13 DIAGNOSIS — M7061 Trochanteric bursitis, right hip: Secondary | ICD-10-CM | POA: Diagnosis not present

## 2020-09-13 DIAGNOSIS — M5136 Other intervertebral disc degeneration, lumbar region: Secondary | ICD-10-CM | POA: Diagnosis not present

## 2020-09-13 DIAGNOSIS — Z8781 Personal history of (healed) traumatic fracture: Secondary | ICD-10-CM | POA: Diagnosis not present

## 2020-09-14 DIAGNOSIS — M545 Low back pain, unspecified: Secondary | ICD-10-CM | POA: Diagnosis not present

## 2020-09-14 DIAGNOSIS — M9903 Segmental and somatic dysfunction of lumbar region: Secondary | ICD-10-CM | POA: Diagnosis not present

## 2020-09-14 DIAGNOSIS — M9902 Segmental and somatic dysfunction of thoracic region: Secondary | ICD-10-CM | POA: Diagnosis not present

## 2020-09-14 DIAGNOSIS — M47812 Spondylosis without myelopathy or radiculopathy, cervical region: Secondary | ICD-10-CM | POA: Diagnosis not present

## 2020-09-14 DIAGNOSIS — M9901 Segmental and somatic dysfunction of cervical region: Secondary | ICD-10-CM | POA: Diagnosis not present

## 2020-09-14 DIAGNOSIS — S233XXA Sprain of ligaments of thoracic spine, initial encounter: Secondary | ICD-10-CM | POA: Diagnosis not present

## 2020-09-14 DIAGNOSIS — M47816 Spondylosis without myelopathy or radiculopathy, lumbar region: Secondary | ICD-10-CM | POA: Diagnosis not present

## 2020-09-26 DIAGNOSIS — H40053 Ocular hypertension, bilateral: Secondary | ICD-10-CM | POA: Diagnosis not present

## 2020-10-27 DIAGNOSIS — H40053 Ocular hypertension, bilateral: Secondary | ICD-10-CM | POA: Diagnosis not present

## 2020-11-23 DIAGNOSIS — M7061 Trochanteric bursitis, right hip: Secondary | ICD-10-CM | POA: Diagnosis not present

## 2020-12-22 DIAGNOSIS — E1159 Type 2 diabetes mellitus with other circulatory complications: Secondary | ICD-10-CM | POA: Diagnosis not present

## 2020-12-22 DIAGNOSIS — E7849 Other hyperlipidemia: Secondary | ICD-10-CM | POA: Diagnosis not present

## 2020-12-22 DIAGNOSIS — I10 Essential (primary) hypertension: Secondary | ICD-10-CM | POA: Diagnosis not present

## 2020-12-28 ENCOUNTER — Encounter: Payer: Self-pay | Admitting: Dermatology

## 2020-12-28 ENCOUNTER — Ambulatory Visit: Payer: Medicare Other | Admitting: Dermatology

## 2020-12-28 ENCOUNTER — Other Ambulatory Visit: Payer: Self-pay

## 2020-12-28 DIAGNOSIS — B079 Viral wart, unspecified: Secondary | ICD-10-CM | POA: Diagnosis not present

## 2020-12-28 DIAGNOSIS — D485 Neoplasm of uncertain behavior of skin: Secondary | ICD-10-CM

## 2020-12-28 DIAGNOSIS — Z1283 Encounter for screening for malignant neoplasm of skin: Secondary | ICD-10-CM

## 2020-12-28 DIAGNOSIS — Z85828 Personal history of other malignant neoplasm of skin: Secondary | ICD-10-CM | POA: Diagnosis not present

## 2020-12-28 NOTE — Patient Instructions (Signed)

## 2021-01-13 ENCOUNTER — Encounter: Payer: Self-pay | Admitting: Dermatology

## 2021-01-13 NOTE — Progress Notes (Signed)
   Follow-Up Visit   [place] is a 80 y.o. male who presents for the following: Annual Exam (Left lower leg- ? Wart ).  Growth on leg, general skin check Location:  Duration:  Quality:  Associated Signs/Symptoms: Modifying Factors:  Severity:  Timing: Context:   Objective  Well appearing patient in no apparent distress; mood and affect are within normal limits. Left Lower Leg - Anterior Verrucous 4 mm crusted papule       Neck - Anterior No sign recurrent SCCA  Mid Back, Torso - Posterior (Back) No atypical pigmented lesions    All skin waist up examined.  Plus legs   Assessment & Plan    Neoplasm of uncertain behavior of skin Left Lower Leg - Anterior  Skin / nail biopsy Type of biopsy: tangential   Informed consent: discussed and consent obtained   Timeout: patient name, date of birth, surgical site, and procedure verified   Anesthesia: the lesion was anesthetized in a standard fashion   Anesthetic:  1% lidocaine w/ epinephrine 1-100,000 local infiltration Instrument used: flexible razor blade   Hemostasis achieved with: ferric subsulfate   Outcome: patient tolerated procedure well   Post-procedure details: sterile dressing applied and wound care instructions given   Dressing type: bandage and petrolatum    Destruction of lesion Complexity: simple   Destruction method: electrodesiccation and curettage   Informed consent: discussed and consent obtained   Timeout:  patient name, date of birth, surgical site, and procedure verified Anesthesia: the lesion was anesthetized in a standard fashion   Anesthetic:  1% lidocaine w/ epinephrine 1-100,000 local infiltration Curettage performed in three different directions: Yes   Electrodesiccation performed over the curetted area: Yes   Curettage cycles:  1 Margin per side (cm):  0.1 Final wound size (cm):  0.9 Hemostasis achieved with:  ferric subsulfate and electrodesiccation Outcome:  patient tolerated procedure well with no complications   Post-procedure details: sterile dressing applied and wound care instructions given   Dressing type: bandage and petrolatum    Specimen 1 - Surgical pathology Differential Diagnosis: R/O Wart - treated after biopsy  Check Margins: No  After shave biopsy the base was cauterized.  Personal history of skin cancer Neck - Anterior  As needed change  Encounter for screening for malignant neoplasm of skin (2) Torso - Posterior (Back); Mid Back  Annual skin examination      I, Lavonna Monarch, MD, have reviewed all documentation for this visit.  The documentation on 01/13/21 for the exam, diagnosis, procedures, and orders are all accurate and complete.

## 2021-01-20 DIAGNOSIS — M7061 Trochanteric bursitis, right hip: Secondary | ICD-10-CM | POA: Diagnosis not present

## 2021-01-20 DIAGNOSIS — M47816 Spondylosis without myelopathy or radiculopathy, lumbar region: Secondary | ICD-10-CM | POA: Diagnosis not present

## 2021-02-15 DIAGNOSIS — E1159 Type 2 diabetes mellitus with other circulatory complications: Secondary | ICD-10-CM | POA: Diagnosis not present

## 2021-02-15 DIAGNOSIS — I10 Essential (primary) hypertension: Secondary | ICD-10-CM | POA: Diagnosis not present

## 2021-02-15 DIAGNOSIS — E7849 Other hyperlipidemia: Secondary | ICD-10-CM | POA: Diagnosis not present

## 2021-02-15 DIAGNOSIS — M5431 Sciatica, right side: Secondary | ICD-10-CM | POA: Diagnosis not present

## 2021-02-15 DIAGNOSIS — Z0000 Encounter for general adult medical examination without abnormal findings: Secondary | ICD-10-CM | POA: Diagnosis not present

## 2021-02-15 DIAGNOSIS — Z681 Body mass index (BMI) 19 or less, adult: Secondary | ICD-10-CM | POA: Diagnosis not present

## 2021-03-02 DIAGNOSIS — R634 Abnormal weight loss: Secondary | ICD-10-CM | POA: Diagnosis not present

## 2021-04-25 DIAGNOSIS — H40053 Ocular hypertension, bilateral: Secondary | ICD-10-CM | POA: Diagnosis not present

## 2021-05-22 ENCOUNTER — Encounter (INDEPENDENT_AMBULATORY_CARE_PROVIDER_SITE_OTHER): Payer: Self-pay | Admitting: *Deleted

## 2021-08-01 ENCOUNTER — Encounter (INDEPENDENT_AMBULATORY_CARE_PROVIDER_SITE_OTHER): Payer: Self-pay | Admitting: *Deleted

## 2021-08-03 ENCOUNTER — Telehealth (INDEPENDENT_AMBULATORY_CARE_PROVIDER_SITE_OTHER): Payer: Self-pay | Admitting: *Deleted

## 2021-08-03 ENCOUNTER — Encounter (INDEPENDENT_AMBULATORY_CARE_PROVIDER_SITE_OTHER): Payer: Self-pay | Admitting: *Deleted

## 2021-08-03 ENCOUNTER — Other Ambulatory Visit (INDEPENDENT_AMBULATORY_CARE_PROVIDER_SITE_OTHER): Payer: Self-pay

## 2021-08-03 DIAGNOSIS — Z86010 Personal history of colonic polyps: Secondary | ICD-10-CM

## 2021-08-03 NOTE — Telephone Encounter (Signed)
Per Dr Sherrie Sport it is ok for patient to hold Plavix 5 days prior to TCS sch'd 09/20/21 - patient aware

## 2021-08-03 NOTE — Telephone Encounter (Signed)
Patient needs trilyte 

## 2021-08-04 MED ORDER — PEG 3350-KCL-NA BICARB-NACL 420 G PO SOLR: 4000.0000 mL | Freq: Once | ORAL | 0 refills | Status: AC

## 2021-08-08 DIAGNOSIS — Z6821 Body mass index (BMI) 21.0-21.9, adult: Secondary | ICD-10-CM | POA: Diagnosis not present

## 2021-08-08 DIAGNOSIS — E1159 Type 2 diabetes mellitus with other circulatory complications: Secondary | ICD-10-CM | POA: Diagnosis not present

## 2021-08-08 DIAGNOSIS — M5431 Sciatica, right side: Secondary | ICD-10-CM | POA: Diagnosis not present

## 2021-08-08 DIAGNOSIS — I10 Essential (primary) hypertension: Secondary | ICD-10-CM | POA: Diagnosis not present

## 2021-08-08 DIAGNOSIS — Z0000 Encounter for general adult medical examination without abnormal findings: Secondary | ICD-10-CM | POA: Diagnosis not present

## 2021-08-08 DIAGNOSIS — E7849 Other hyperlipidemia: Secondary | ICD-10-CM | POA: Diagnosis not present

## 2021-08-24 ENCOUNTER — Telehealth (INDEPENDENT_AMBULATORY_CARE_PROVIDER_SITE_OTHER): Payer: Self-pay | Admitting: *Deleted

## 2021-08-24 NOTE — Telephone Encounter (Signed)
Referring MD/PCP: hasanaj ? ?Procedure: tcs ? ?Reason/Indication:  hx polyps ? ?Has patient had this procedure before?  Yes, 2016 ? If so, when, by whom and where?   ? ?Is there a family history of colon cancer?  no ? Who?  What age when diagnosed?   ? ?Is patient diabetic? If yes, Type 1 or Type 2   yes, type 2 ?     ?Does patient have prosthetic heart valve or mechanical valve?  no ? ?Do you have a pacemaker/defibrillator?  no ? ?Has patient ever had endocarditis/atrial fibrillation? no ? ?Does patient use oxygen? no ? ?Has patient had joint replacement within last 12 months?  no ? ?Is patient constipated or do they take laxatives? no ? ?Does patient have a history of alcohol/drug use?  no ? ?Have you had a stroke/heart attack last 6 mths? no ? ?Do you take medicine for weight loss?  no ? ?For male patients,: have you had a hysterectomy  ?                     are you post menopausal  ?                     do you still have your menstrual cycle  ? ?Is patient on blood thinner such as Coumadin, Plavix and/or Aspirin? yes ? ?Medications: asa 81 mg daily, metformin 1000 mg bid, atorvastatin 40 mg daily, CoQ 10 400 mg daily, plavix 75 mg daily, vit b12 daily, vit d3 bid, vit c daily, ramipril 1.25 mg daily, protonix 40 mg daily, latanoprost eye drop, timolol eye drop ? ?Allergies: iodinated contrast dye ? ?Medication Adjustment per Dr Rehman/Dr Jenetta Downer asa 2 days, plavix 5 days (see note in chart), hold metformin evening before and morning of ? ?Procedure date & time: 09/20/21 ? ? ?

## 2021-08-28 DIAGNOSIS — H2702 Aphakia, left eye: Secondary | ICD-10-CM | POA: Diagnosis not present

## 2021-08-28 DIAGNOSIS — H5211 Myopia, right eye: Secondary | ICD-10-CM | POA: Diagnosis not present

## 2021-08-28 DIAGNOSIS — H2511 Age-related nuclear cataract, right eye: Secondary | ICD-10-CM | POA: Diagnosis not present

## 2021-08-28 DIAGNOSIS — E119 Type 2 diabetes mellitus without complications: Secondary | ICD-10-CM | POA: Diagnosis not present

## 2021-08-28 DIAGNOSIS — H40053 Ocular hypertension, bilateral: Secondary | ICD-10-CM | POA: Diagnosis not present

## 2021-09-06 DIAGNOSIS — M7061 Trochanteric bursitis, right hip: Secondary | ICD-10-CM | POA: Diagnosis not present

## 2021-09-06 DIAGNOSIS — M47816 Spondylosis without myelopathy or radiculopathy, lumbar region: Secondary | ICD-10-CM | POA: Diagnosis not present

## 2021-09-14 NOTE — Patient Instructions (Signed)
? ? ? ? ? ? Billy Jacobson ? 09/14/2021  ?  ? '@PREFPERIOPPHARMACY'$ @ ? ? Your procedure is scheduled on  09/20/2021. ? ? Report to [facility]son at  0600  A.M. ? ? Call this number if you have problems the morning of surgery: ? [phone] ? ? Remember: ? Follow the diet and prep instructions given to you by the office. ? ?Your last dose of plavix should have been 09/14/2021 and your last dose of aspirin should be 09/17/2021. ?  ? ?DO NOT take any medications for diabetes the morning of your procedure. ? ? ? Take these medicines the morning of surgery with A SIP OF WATER  ? ?protonix. ?  ? ? Do not wear jewelry, make-up or nail polish. ? Do not wear lotions, powders, or perfumes, or deodorant. ? Do not shave 48 hours prior to surgery.  Men may shave face and neck. ? Do not bring valuables to the hospital. ? [facility] is not responsible for any belongings or valuables. ? ?Contacts, dentures or bridgework may not be worn into surgery.  Leave your suitcase in the car.  After surgery it may be brought to your room. ? ?For patients admitted to the hospital, discharge time will be determined by your treatment team. ? ?Patients discharged the day of surgery will not be allowed to drive home and must have someone with them for 24 hours.  ? ? ?Special instructions:   DO NOT smoke tobacco or vape for 24 hours before your procedure. ? ? ?Please read over the following fact sheets that you were given. ?Anesthesia Post-op Instructions and Care and Recovery After Surgery ?  ? ? ? Colonoscopy, Adult, Care After ?This sheet gives you information about how to care for yourself after your procedure. Your health care provider may also give you more specific instructions. If you have problems or questions, contact your health care provider. ?What can I expect after the procedure? ?After the procedure, it is common to have: ?A small amount of blood in your stool for 24 hours after the procedure. ?Some gas. ?Mild cramping or bloating of your  abdomen. ?Follow these instructions at home: ?Eating and drinking ? ?Drink enough fluid to keep your urine pale yellow. ?Follow instructions from your health care provider about eating or drinking restrictions. ?Resume your normal diet as instructed by your health care provider. Avoid heavy or fried foods that are hard to digest. ?Activity ?Rest as told by your health care provider. ?Avoid sitting for a long time without moving. Get up to take short walks every 1-2 hours. This is important to improve blood flow and breathing. Ask for help if you feel weak or unsteady. ?Return to your normal activities as told by your health care provider. Ask your health care provider what activities are safe for you. ?Managing cramping and bloating ? ?Try walking around when you have cramps or feel bloated. ?Apply heat to your abdomen as told by your health care provider. Use the heat source that your health care provider recommends, such as a moist heat pack or a heating pad. ?Place a towel between your skin and the heat source. ?Leave the heat on for 20-30 minutes. ?Remove the heat if your skin turns bright red. This is especially important if you are unable to feel pain, heat, or cold. You may have a greater risk of getting burned. ?General instructions ?If you were given a sedative during the procedure, it can affect you for several hours.  Do not drive or operate machinery until your health care provider says that it is safe. ?For the first 24 hours after the procedure: ?Do not sign important documents. ?Do not drink alcohol. ?Do your regular daily activities at a slower pace than normal. ?Eat soft foods that are easy to digest. ?Take over-the-counter and prescription medicines only as told by your health care provider. ?Keep all follow-up visits as told by your health care provider. This is important. ?Contact a health care provider if: ?You have blood in your stool 2-3 days after the procedure. ?Get help right away if you  have: ?More than a small spotting of blood in your stool. ?Large blood clots in your stool. ?Swelling of your abdomen. ?Nausea or vomiting. ?A fever. ?Increasing pain in your abdomen that is not relieved with medicine. ?Summary ?After the procedure, it is common to have a small amount of blood in your stool. You may also have mild cramping and bloating of your abdomen. ?If you were given a sedative during the procedure, it can affect you for several hours. Do not drive or operate machinery until your health care provider says that it is safe. ?Get help right away if you have a lot of blood in your stool, nausea or vomiting, a fever, or increased pain in your abdomen. ?This information is not intended to replace advice given to you by your health care provider. Make sure you discuss any questions you have with your health care provider. ?Document Revised: 04/17/2019 Document Reviewed: 01/05/2019 ?Elsevier Patient Education ? [place]. ?Monitored Anesthesia Care, Care After ?This sheet gives you information about how to care for yourself after your procedure. Your health care provider may also give you more specific instructions. If you have problems or questions, contact your health care provider. ?What can I expect after the procedure? ?After the procedure, it is common to have: ?Tiredness. ?Forgetfulness about what happened after the procedure. ?Impaired judgment for important decisions. ?Nausea or vomiting. ?Some difficulty with balance. ?Follow these instructions at home: ?For the time period you were told by your health care provider: ?  ?Rest as needed. ?Do not participate in activities where you could fall or become injured. ?Do not drive or use machinery. ?Do not drink alcohol. ?Do not take sleeping pills or medicines that cause drowsiness. ?Do not make important decisions or sign legal documents. ?Do not take care of children on your own. ?Eating and drinking ?Follow the diet that is recommended by  your health care provider. ?Drink enough fluid to keep your urine pale yellow. ?If you vomit: ?Drink water, juice, or soup when you can drink without vomiting. ?Make sure you have little or no nausea before eating solid foods. ?General instructions ?Have a responsible adult stay with you for the time you are told. It is important to have someone help care for you until you are awake and alert. ?Take over-the-counter and prescription medicines only as told by your health care provider. ?If you have sleep apnea, surgery and certain medicines can increase your risk for breathing problems. Follow instructions from your health care provider about wearing your sleep device: ?Anytime you are sleeping, including during daytime naps. ?While taking prescription pain medicines, sleeping medicines, or medicines that make you drowsy. ?Avoid smoking. ?Keep all follow-up visits as told by your health care provider. This is important. ?Contact a health care provider if: ?You keep feeling nauseous or you keep vomiting. ?You feel light-headed. ?You are still sleepy or having trouble with balance  after 24 hours. ?You develop a rash. ?You have a fever. ?You have redness or swelling around the IV site. ?Get help right away if: ?You have trouble breathing. ?You have new-onset confusion at home. ?Summary ?For several hours after your procedure, you may feel tired. You may also be forgetful and have poor judgment. ?Have a responsible adult stay with you for the time you are told. It is important to have someone help care for you until you are awake and alert. ?Rest as told. Do not drive or operate machinery. Do not drink alcohol or take sleeping pills. ?Get help right away if you have trouble breathing, or if you suddenly become confused. ?This information is not intended to replace advice given to you by your health care provider. Make sure you discuss any questions you have with your health care provider. ?Document Revised: 02/25/2020  Document Reviewed: 05/14/2019 ?Elsevier Patient Education ? [place]. ? ?

## 2021-09-15 ENCOUNTER — Encounter (HOSPITAL_COMMUNITY)
Admission: RE | Admit: 2021-09-15 | Discharge: 2021-09-15 | Disposition: A | Discharge status: Home / Self Care | Payer: Medicare Other | Source: Ambulatory Visit | Attending: Internal Medicine | Admitting: Internal Medicine

## 2021-09-15 ENCOUNTER — Encounter (HOSPITAL_COMMUNITY): Payer: Self-pay

## 2021-09-15 DIAGNOSIS — Z794 Long term (current) use of insulin: Secondary | ICD-10-CM | POA: Diagnosis not present

## 2021-09-15 DIAGNOSIS — E119 Type 2 diabetes mellitus without complications: Secondary | ICD-10-CM | POA: Insufficient documentation

## 2021-09-15 DIAGNOSIS — Z01818 Encounter for other preprocedural examination: Secondary | ICD-10-CM | POA: Insufficient documentation

## 2021-09-15 HISTORY — DX: Depression, unspecified: F32.A

## 2021-09-15 HISTORY — DX: Anxiety disorder, unspecified: F41.9

## 2021-09-15 LAB — BASIC METABOLIC PANEL
Anion gap: 10 (ref 5–15)
BUN: 8 mg/dL (ref 8–23)
CO2: 23 mmol/L (ref 22–32)
Calcium: 9 mg/dL (ref 8.9–10.3)
Chloride: 106 mmol/L (ref 98–111)
Creatinine, Ser: 0.66 mg/dL (ref 0.61–1.24)
GFR, Estimated: >60 mL/min (ref >60)
Glucose, Bld: 97 mg/dL (ref 70–99)
Potassium: 4.4 mmol/L (ref 3.5–5.1)
Sodium: 139 mmol/L (ref 135–145)

## 2021-09-20 ENCOUNTER — Ambulatory Visit (HOSPITAL_COMMUNITY): Payer: Medicare Other | Admitting: Anesthesiology

## 2021-09-20 ENCOUNTER — Encounter (HOSPITAL_COMMUNITY): Payer: Self-pay | Admitting: Internal Medicine

## 2021-09-20 ENCOUNTER — Ambulatory Visit (HOSPITAL_BASED_OUTPATIENT_CLINIC_OR_DEPARTMENT_OTHER): Payer: Medicare Other | Admitting: Anesthesiology

## 2021-09-20 ENCOUNTER — Encounter (HOSPITAL_COMMUNITY)
Admission: RE | Disposition: A | Discharge status: Home / Self Care | Payer: Self-pay | Source: Home / Self Care | Attending: Internal Medicine

## 2021-09-20 ENCOUNTER — Ambulatory Visit (HOSPITAL_COMMUNITY)
Admission: RE | Admit: 2021-09-20 | Discharge: 2021-09-20 | Disposition: A | Discharge status: Home / Self Care | Payer: Medicare Other | Attending: Internal Medicine | Admitting: Internal Medicine

## 2021-09-20 DIAGNOSIS — Z87891 Personal history of nicotine dependence: Secondary | ICD-10-CM | POA: Insufficient documentation

## 2021-09-20 DIAGNOSIS — K648 Other hemorrhoids: Secondary | ICD-10-CM

## 2021-09-20 DIAGNOSIS — Z08 Encounter for follow-up examination after completed treatment for malignant neoplasm: Secondary | ICD-10-CM | POA: Diagnosis not present

## 2021-09-20 DIAGNOSIS — Z86010 Personal history of colonic polyps: Secondary | ICD-10-CM

## 2021-09-20 DIAGNOSIS — K5730 Diverticulosis of large intestine without perforation or abscess without bleeding: Secondary | ICD-10-CM

## 2021-09-20 DIAGNOSIS — E1165 Type 2 diabetes mellitus with hyperglycemia: Secondary | ICD-10-CM | POA: Insufficient documentation

## 2021-09-20 DIAGNOSIS — K219 Gastro-esophageal reflux disease without esophagitis: Secondary | ICD-10-CM | POA: Diagnosis not present

## 2021-09-20 DIAGNOSIS — K635 Polyp of colon: Secondary | ICD-10-CM

## 2021-09-20 DIAGNOSIS — Z85038 Personal history of other malignant neoplasm of large intestine: Secondary | ICD-10-CM | POA: Diagnosis not present

## 2021-09-20 DIAGNOSIS — Z7902 Long term (current) use of antithrombotics/antiplatelets: Secondary | ICD-10-CM | POA: Diagnosis not present

## 2021-09-20 DIAGNOSIS — F32A Depression, unspecified: Secondary | ICD-10-CM | POA: Diagnosis not present

## 2021-09-20 DIAGNOSIS — Z1211 Encounter for screening for malignant neoplasm of colon: Secondary | ICD-10-CM | POA: Insufficient documentation

## 2021-09-20 DIAGNOSIS — I10 Essential (primary) hypertension: Secondary | ICD-10-CM | POA: Insufficient documentation

## 2021-09-20 DIAGNOSIS — F419 Anxiety disorder, unspecified: Secondary | ICD-10-CM | POA: Diagnosis not present

## 2021-09-20 DIAGNOSIS — Z955 Presence of coronary angioplasty implant and graft: Secondary | ICD-10-CM | POA: Diagnosis not present

## 2021-09-20 HISTORY — PX: BIOPSY: SHX5522

## 2021-09-20 HISTORY — PX: COLONOSCOPY WITH PROPOFOL: SHX5780

## 2021-09-20 LAB — GLUCOSE, CAPILLARY: Glucose-Capillary: 135 mg/dL — ABNORMAL HIGH (ref 70–99)

## 2021-09-20 SURGERY — COLONOSCOPY WITH PROPOFOL
Anesthesia: General

## 2021-09-20 MED ORDER — PHENYLEPHRINE HCL (PRESSORS) 10 MG/ML IV SOLN
INTRAVENOUS | Status: DC | PRN
  Administered 2021-09-20: 40 ug via INTRAVENOUS

## 2021-09-20 MED ORDER — PROPOFOL 10 MG/ML IV BOLUS
INTRAVENOUS | Status: DC | PRN
Start: 2021-09-20 — End: 2021-09-20
  Administered 2021-09-20: 60 mg via INTRAVENOUS

## 2021-09-20 MED ORDER — STERILE WATER FOR IRRIGATION IR SOLN
Status: DC | PRN
  Administered 2021-09-20: .6 mL

## 2021-09-20 MED ORDER — PROPOFOL 500 MG/50ML IV EMUL
INTRAVENOUS | Status: DC | PRN
  Administered 2021-09-20: 125 ug/kg/min via INTRAVENOUS

## 2021-09-20 MED ORDER — LACTATED RINGERS IV SOLN: INTRAVENOUS | Status: DC

## 2021-09-20 NOTE — Discharge Instructions (Signed)
Resume aspirin and clopidogrel on 09/21/2021. ?Resume other medications as before. ?High-fiber diet. ?No driving for 24 hours. ?Physician will call with biopsy results. ?

## 2021-09-20 NOTE — H&P (Signed)
Billy Jacobson is an 81 y.o. male.   ?Chief Complaint: Patient is here for colonoscopy. ?HPI: Patient is 81 year old Caucasian male with history of colonic adenomas as well as history of invasive cancer and a polyp who is here for surveillance examination.  Last examination was in December 2016 with removal of 3 polyps and one was a tubular adenoma.  Patient states he had colonoscopy possibly in mid 90s by Dr. Aurelio Jew and had polyp removed which had invasive carcinoma.  Apparently the margins were clear and he chest pain followed.  Family history is negative for colon cancer.  He denies abdominal pain change in bowel habits or rectal bleeding. ?Last clopidogrel dose was on 09/14/2021.  Last aspirin dose was 4 days ago. ? ?Past Medical History:  ?Diagnosis Date  ? Anxiety   ? Cancer [facility])   ? Colon cancer history of invasive cancer in a polyp in mid 77s.  ? Colon polyps   ? Depression   ? Diabetes mellitus without complication ([place])   ? GERD (gastroesophageal reflux disease)   ? Hypercholesteremia   ? Hypertension   ? Myocardial infarction [facility]) 1997  ? Squamous cell carcinoma of skin 09/23/2019  ? left supraclavicular area-(CX35FU)  ? ? ?Past Surgical History:  ?Procedure Laterality Date  ? CARDIAC CATHETERIZATION    ? CAROTID ANGIOGRAPHY  2019  ? COLONOSCOPY    ? COLONOSCOPY N/A 05/27/2015  ? Procedure: COLONOSCOPY;  Surgeon: Rogene Houston, MD;  Location: [facility];  Service: Endoscopy;  Laterality: N/A;  1030  ? CORONARY ANGIOPLASTY  1997  ? CORONARY ANGIOPLASTY WITH STENT PLACEMENT  2002  ? Left rotator cuff surgery    ? Right inguinal hernia repair    ? ? ?History reviewed. No pertinent family history. ?Social History:  reports that he has quit smoking. He has never used smokeless tobacco. He reports current alcohol use. He reports that he does not use drugs. ? ?Allergies:  ?Allergies  ?Allergen Reactions  ? Iodinated Contrast Media Rash  ? ? ?Medications Prior to Admission  ?Medication Sig  Dispense Refill  ? Ascorbic Acid (VITAMIN C) 1000 MG tablet Take 1,000 mg by mouth daily.    ? aspirin EC 81 MG tablet Take 81 mg by mouth daily.    ? atorvastatin (LIPITOR) 40 MG tablet Take 40 mg by mouth daily.  0  ? Cholecalciferol (VITAMIN D-3) 1000 UNITS CAPS Take 2,000 Units by mouth daily.    ? clopidogrel (PLAVIX) 75 MG tablet Take 75 mg by mouth daily.    ? CO Q-10 MAXIMUM STRENGTH 400 MG CAPS Take 400 mg by mouth daily.    ? cyanocobalamin 1000 MCG tablet Take 1,000 mcg by mouth daily.    ? latanoprost (XALATAN) 0.005 % ophthalmic solution Place 1 drop into both eyes at bedtime.    ? metFORMIN (GLUCOPHAGE) 1000 MG tablet Take 1,000 mg by mouth 2 (two) times daily.  0  ? Multiple Vitamins-Minerals (CENTRUM SILVER ADULT 50+ PO) Take 1 tablet by mouth daily.    ? pantoprazole (PROTONIX) 40 MG tablet Take 40 mg by mouth daily as needed (acid reflux).    ? ramipril (ALTACE) 1.25 MG capsule Take 1.25 mg by mouth daily.    ? timolol (TIMOPTIC) 0.5 % ophthalmic solution Place 1 drop into both eyes daily.    ? ? ?Results for orders placed or performed during the hospital encounter of 09/20/21 (from the past 48 hour(s))  ?Glucose, capillary     Status:  Abnormal  ? Collection Time: 09/20/21  6:48 AM  ?Result Value Ref Range  ? Glucose-Capillary 135 (H) 70 - 99 mg/dL  ?  Comment: Glucose reference range applies only to samples taken after fasting for at least 8 hours.  ? ?No results found. ? ?Review of Systems ? ?Blood pressure (!) 168/69, pulse 73, temperature 97.8 ?F (36.6 ?C), temperature source Oral, resp. rate 18, height '5\' 8"'$  (1.727 m), weight 67.1 kg, SpO2 100 %. ?Physical Exam ?HENT:  ?   Mouth/Throat:  ?   Mouth: Mucous membranes are moist.  ?   Pharynx: Oropharynx is clear.  ?Eyes:  ?   General: No scleral icterus. ?   Conjunctiva/sclera: Conjunctivae normal.  ?Neck:  ?   Comments: Left carotid endarterectomy scar ?Cardiovascular:  ?   Rate and Rhythm: Normal rate and regular rhythm.  ?   Heart sounds:  Normal heart sounds. No murmur heard. ?Pulmonary:  ?   Effort: Pulmonary effort is normal.  ?   Breath sounds: Normal breath sounds.  ?Abdominal:  ?   General: There is no distension.  ?   Palpations: Abdomen is soft. There is no mass.  ?   Tenderness: There is no abdominal tenderness.  ?Musculoskeletal:     ?   General: No swelling.  ?   Cervical back: Neck supple.  ?Lymphadenopathy:  ?   Cervical: No cervical adenopathy.  ?Skin: ?   General: Skin is warm and dry.  ?Neurological:  ?   Mental Status: He is alert.  ?  ? ?Assessment/Plan ? ?History of colon cancer(invasive cancer in a polyp) and history of colonic adenomas ?Surveillance colonoscopy. ? ?Hildred Laser, MD ?09/20/2021, 7:23 AM ? ? ? ?

## 2021-09-20 NOTE — Anesthesia Postprocedure Evaluation (Signed)
Anesthesia Post Note ? ?Patient: Billy Jacobson ? ?Procedure(s) Performed: COLONOSCOPY WITH PROPOFOL ?BIOPSY ? ?Patient location during evaluation: Phase II ?Anesthesia Type: General ?Level of consciousness: awake ?Pain management: pain level controlled ?Vital Signs Assessment: post-procedure vital signs reviewed and stable ?Respiratory status: spontaneous breathing and respiratory function stable ?Cardiovascular status: blood pressure returned to baseline and stable ?Postop Assessment: no headache and no apparent nausea or vomiting ?Anesthetic complications: no ?Comments: Late entry ? ? ?No notable events documented. ? ? ?Last Vitals:  ?Vitals:  ? 09/20/21 0800 09/20/21 0804  ?BP: (!) 86/41 97/61  ?Pulse:    ?Resp:    ?Temp:    ?SpO2:    ?  ?Last Pain:  ?Vitals:  ? 09/20/21 0757  ?TempSrc: Oral  ?PainSc: 0-No pain  ? ? ?  ?  ?  ?  ?  ?  ? ?Louann Sjogren ? ? ? ? ?

## 2021-09-20 NOTE — Transfer of Care (Signed)
Immediate Anesthesia Transfer of Care Note ? ?Patient: Billy Jacobson ? ?Procedure(s) Performed: COLONOSCOPY WITH PROPOFOL ?BIOPSY ? ?Patient Location: PACU ? ?Anesthesia Type:General ? ?Level of Consciousness: awake, alert  and oriented ? ?Airway & Oxygen Therapy: Patient Spontanous Breathing ? ?Post-op Assessment: Report given to RN, Post -op Vital signs reviewed and stable, Patient moving all extremities X 4 and Patient able to stick tongue midline ? ?Post vital signs: Reviewed ? ?Last Vitals:  ?Vitals Value Taken Time  ?BP 103/41   ?Temp 36.6   ?Pulse 57   ?Resp 12   ?SpO2 100   ? ? ?Last Pain:  ?Vitals:  ? 09/20/21 0643  ?TempSrc: Oral  ?PainSc: 0-No pain  ?   ? ?Patients Stated Pain Goal: 7 (09/20/21 0092) ? ?Complications: No notable events documented. ?

## 2021-09-20 NOTE — Op Note (Signed)
[facility] ?Patient Name: Billy Jacobson ?Procedure Date: 09/20/2021 7:03 AM ?MRN: [id number] ?Date of Birth: [birth year] ?Attending MD: Hildred Laser , MD ?CSN: [id number] ?Age: 81 ?Admit Type: Outpatient ?Procedure:                Colonoscopy ?Indications:              High risk colon cancer surveillance: Personal  ?                          history of colonic polyps, High risk colon cancer  ?                          surveillance: Personal history of colon cancer ?Providers:                Hildred Laser, MD, Janeece Riggers, RN, Wynonia Musty  ?                          Tech, Technician ?Referring MD:             Stoney Bang, MD ?Medicines:                Propofol per Anesthesia ?Complications:            No immediate complications. ?Estimated Blood Loss:     Estimated blood loss was minimal. ?Procedure:                Pre-Anesthesia Assessment: ?                          - Prior to the procedure, a History and Physical  ?                          was performed, and patient medications and  ?                          allergies were reviewed. The patient's tolerance of  ?                          previous anesthesia was also reviewed. The risks  ?                          and benefits of the procedure and the sedation  ?                          options and risks were discussed with the patient.  ?                          All questions were answered, and informed consent  ?                          was obtained. Prior Anticoagulants: The patient  ?                          last took Plavix (clopidogrel) 5 days prior to the  ?  procedure. ASA Grade Assessment: III - A patient  ?                          with severe systemic disease. After reviewing the  ?                          risks and benefits, the patient was deemed in  ?                          satisfactory condition to undergo the procedure. ?                          After obtaining informed consent, the colonoscope  ?                           was passed under direct vision. Throughout the  ?                          procedure, the patient's blood pressure, pulse, and  ?                          oxygen saturations were monitored continuously. The  ?                          PCF-HQ190L ([id number]) scope was introduced through  ?                          the anus and advanced to the the cecum, identified  ?                          by appendiceal orifice and ileocecal valve. The  ?                          colonoscopy was performed without difficulty. The  ?                          patient tolerated the procedure well. The quality  ?                          of the bowel preparation was good. ?Scope In: 7:31:26 AM ?Scope Out: 7:53:52 AM ?Total Procedure Duration: 0 hours 22 minutes 26 seconds  ?Findings: ?     The perianal and digital rectal examinations were normal. ?     Scattered diverticula were found in the sigmoid colon, hepatic flexure  ?     and ascending colon. ?     Two polyps were found in the distal sigmoid colon. The polyps were small  ?     in size. These were biopsied with a cold forceps for histology. The  ?     pathology specimen was placed into Bottle Number 1. ?     Internal hemorrhoids were found during retroflexion. The hemorrhoids  ?     were small. ?Impression:               - Diverticulosis in the sigmoid colon, at the  ?  hepatic flexure and in the ascending colon. ?                          - Two small polyps in the distal sigmoid colon.  ?                          Biopsied. ?                          - Internal hemorrhoids. ?Moderate Sedation: ?     Per Anesthesia Care ?Recommendation:           - Patient has a contact number available for  ?                          emergencies. The signs and symptoms of potential  ?                          delayed complications were discussed with the  ?                          patient. Return to normal activities tomorrow.  ?                           Written discharge instructions were provided to the  ?                          patient. ?                          - High fiber diet today. ?                          - Continue present medications. ?                          - Resume Plavix (clopidogrel) tomorrow at prior  ?                          dose. ?                          - Await pathology results. ?                          - No recommendation at this time regarding repeat  ?                          colonoscopy. ?Procedure Code(s):        --- Professional --- ?                          [phone], Colonoscopy, flexible; with biopsy, single  ?                          or multiple ?Diagnosis Code(s):        --- Professional --- ?  Z86.010, Personal history of colonic polyps ?                          Z85.038, Personal history of other malignant  ?                          neoplasm of large intestine ?                          K64.8, Other hemorrhoids ?                          K63.5, Polyp of colon ?                          K57.30, Diverticulosis of large intestine without  ?                          perforation or abscess without bleeding ?CPT copyright 2019 American Medical Association. All rights reserved. ?The codes documented in this report are preliminary and upon coder review may  ?be revised to meet current compliance requirements. ?Hildred Laser, MD ?Hildred Laser, MD ?09/20/2021 8:02:02 AM ?This report has been signed electronically. ?Number of Addenda: 0 ?

## 2021-09-20 NOTE — Anesthesia Preprocedure Evaluation (Signed)
Anesthesia Evaluation  ?Patient identified by MRN, date of birth, ID band ?Patient awake ? ? ? ?Reviewed: ?Allergy & Precautions, H&P , NPO status , Patient's Chart, lab work & pertinent test results, reviewed documented beta blocker date and time  ? ?Airway ?Mallampati: II ? ?TM Distance: >3 FB ?Neck ROM: full ? ? ? Dental ?no notable dental hx. ? ?  ?Pulmonary ?neg pulmonary ROS, former smoker,  ?  ?Pulmonary exam normal ?breath sounds clear to auscultation ? ? ? ? ? ? Cardiovascular ?Exercise Tolerance: Good ?hypertension, + CAD, + Past MI and + Cardiac Stents  ? ?Rhythm:regular Rate:Normal ? ? ?  ?Neuro/Psych ?PSYCHIATRIC DISORDERS Anxiety Depression negative neurological ROS ?   ? GI/Hepatic ?Neg liver ROS, GERD  Medicated,  ?Endo/Other  ?negative endocrine ROSdiabetes, Poorly Controlled, Type 2 ? Renal/GU ?negative Renal ROS  ?negative genitourinary ?  ?Musculoskeletal ? ? Abdominal ?  ?Peds ? Hematology ?negative hematology ROS ?(+)   ?Anesthesia Other Findings ? ? Reproductive/Obstetrics ?negative OB ROS ? ?  ? ? ? ? ? ? ? ? ? ? ? ? ? ?  ?  ? ? ? ? ? ? ? ? ?Anesthesia Physical ?Anesthesia Plan ? ?ASA: 3 ? ?Anesthesia Plan: General  ? ?Post-op Pain Management:   ? ?Induction:  ? ?PONV Risk Score and Plan: Propofol infusion ? ?Airway Management Planned:  ? ?Additional Equipment:  ? ?Intra-op Plan:  ? ?Post-operative Plan:  ? ?Informed Consent: I have reviewed the patients History and Physical, chart, labs and discussed the procedure including the risks, benefits and alternatives for the proposed anesthesia with the patient or authorized representative who has indicated his/her understanding and acceptance.  ? ? ? ?Dental Advisory Given ? ?Plan Discussed with: CRNA ? ?Anesthesia Plan Comments:   ? ? ? ? ? ? ?Anesthesia Quick Evaluation ? ?

## 2021-09-21 LAB — SURGICAL PATHOLOGY

## 2021-09-25 ENCOUNTER — Encounter (HOSPITAL_COMMUNITY): Payer: Self-pay | Admitting: Internal Medicine

## 2021-11-22 DIAGNOSIS — M7061 Trochanteric bursitis, right hip: Secondary | ICD-10-CM | POA: Diagnosis not present

## 2022-01-03 DIAGNOSIS — M47816 Spondylosis without myelopathy or radiculopathy, lumbar region: Secondary | ICD-10-CM | POA: Diagnosis not present

## 2022-01-03 DIAGNOSIS — M7061 Trochanteric bursitis, right hip: Secondary | ICD-10-CM | POA: Diagnosis not present

## 2022-01-18 DIAGNOSIS — I10 Essential (primary) hypertension: Secondary | ICD-10-CM | POA: Diagnosis not present

## 2022-01-18 DIAGNOSIS — L7611 Accidental puncture and laceration of skin and subcutaneous tissue during a dermatologic procedure: Secondary | ICD-10-CM | POA: Diagnosis not present

## 2022-01-18 DIAGNOSIS — Z6821 Body mass index (BMI) 21.0-21.9, adult: Secondary | ICD-10-CM | POA: Diagnosis not present

## 2022-02-06 DIAGNOSIS — E1159 Type 2 diabetes mellitus with other circulatory complications: Secondary | ICD-10-CM | POA: Diagnosis not present

## 2022-02-06 DIAGNOSIS — I10 Essential (primary) hypertension: Secondary | ICD-10-CM | POA: Diagnosis not present

## 2022-02-06 DIAGNOSIS — Z6820 Body mass index (BMI) 20.0-20.9, adult: Secondary | ICD-10-CM | POA: Diagnosis not present

## 2022-02-06 DIAGNOSIS — Z0000 Encounter for general adult medical examination without abnormal findings: Secondary | ICD-10-CM | POA: Diagnosis not present

## 2022-02-06 DIAGNOSIS — M1000 Idiopathic gout, unspecified site: Secondary | ICD-10-CM | POA: Diagnosis not present

## 2022-02-06 DIAGNOSIS — L7611 Accidental puncture and laceration of skin and subcutaneous tissue during a dermatologic procedure: Secondary | ICD-10-CM | POA: Diagnosis not present

## 2022-02-06 DIAGNOSIS — D692 Other nonthrombocytopenic purpura: Secondary | ICD-10-CM | POA: Diagnosis not present

## 2022-03-05 DIAGNOSIS — H40053 Ocular hypertension, bilateral: Secondary | ICD-10-CM | POA: Diagnosis not present

## 2022-07-03 DIAGNOSIS — Z955 Presence of coronary angioplasty implant and graft: Secondary | ICD-10-CM | POA: Diagnosis not present

## 2022-07-03 DIAGNOSIS — I259 Chronic ischemic heart disease, unspecified: Secondary | ICD-10-CM | POA: Diagnosis not present

## 2022-07-03 DIAGNOSIS — E785 Hyperlipidemia, unspecified: Secondary | ICD-10-CM | POA: Diagnosis not present

## 2022-07-03 DIAGNOSIS — I10 Essential (primary) hypertension: Secondary | ICD-10-CM | POA: Diagnosis not present

## 2022-07-03 DIAGNOSIS — I6522 Occlusion and stenosis of left carotid artery: Secondary | ICD-10-CM | POA: Diagnosis not present

## 2022-07-03 DIAGNOSIS — I252 Old myocardial infarction: Secondary | ICD-10-CM | POA: Diagnosis not present

## 2022-07-08 DIAGNOSIS — I498 Other specified cardiac arrhythmias: Secondary | ICD-10-CM | POA: Diagnosis not present

## 2022-07-08 DIAGNOSIS — Z955 Presence of coronary angioplasty implant and graft: Secondary | ICD-10-CM | POA: Diagnosis not present

## 2022-08-07 DIAGNOSIS — E1159 Type 2 diabetes mellitus with other circulatory complications: Secondary | ICD-10-CM | POA: Diagnosis not present

## 2022-08-07 DIAGNOSIS — I10 Essential (primary) hypertension: Secondary | ICD-10-CM | POA: Diagnosis not present

## 2022-08-07 DIAGNOSIS — Z0000 Encounter for general adult medical examination without abnormal findings: Secondary | ICD-10-CM | POA: Diagnosis not present

## 2022-08-07 DIAGNOSIS — E7849 Other hyperlipidemia: Secondary | ICD-10-CM | POA: Diagnosis not present

## 2022-08-07 DIAGNOSIS — D692 Other nonthrombocytopenic purpura: Secondary | ICD-10-CM | POA: Diagnosis not present

## 2022-08-07 DIAGNOSIS — Z6821 Body mass index (BMI) 21.0-21.9, adult: Secondary | ICD-10-CM | POA: Diagnosis not present

## 2022-08-22 DIAGNOSIS — I272 Pulmonary hypertension, unspecified: Secondary | ICD-10-CM | POA: Diagnosis not present

## 2022-08-22 DIAGNOSIS — I361 Nonrheumatic tricuspid (valve) insufficiency: Secondary | ICD-10-CM | POA: Diagnosis not present

## 2022-08-22 DIAGNOSIS — I081 Rheumatic disorders of both mitral and tricuspid valves: Secondary | ICD-10-CM | POA: Diagnosis not present

## 2022-09-19 DIAGNOSIS — E119 Type 2 diabetes mellitus without complications: Secondary | ICD-10-CM | POA: Diagnosis not present

## 2022-09-19 DIAGNOSIS — H2511 Age-related nuclear cataract, right eye: Secondary | ICD-10-CM | POA: Diagnosis not present

## 2022-09-19 DIAGNOSIS — H40053 Ocular hypertension, bilateral: Secondary | ICD-10-CM | POA: Diagnosis not present

## 2022-09-19 DIAGNOSIS — H2702 Aphakia, left eye: Secondary | ICD-10-CM | POA: Diagnosis not present

## 2022-09-24 DIAGNOSIS — M47816 Spondylosis without myelopathy or radiculopathy, lumbar region: Secondary | ICD-10-CM | POA: Diagnosis not present

## 2022-09-24 DIAGNOSIS — M9903 Segmental and somatic dysfunction of lumbar region: Secondary | ICD-10-CM | POA: Diagnosis not present

## 2022-09-24 DIAGNOSIS — M545 Low back pain, unspecified: Secondary | ICD-10-CM | POA: Diagnosis not present

## 2022-09-24 DIAGNOSIS — M9901 Segmental and somatic dysfunction of cervical region: Secondary | ICD-10-CM | POA: Diagnosis not present

## 2022-09-24 DIAGNOSIS — S233XXA Sprain of ligaments of thoracic spine, initial encounter: Secondary | ICD-10-CM | POA: Diagnosis not present

## 2022-09-24 DIAGNOSIS — M9902 Segmental and somatic dysfunction of thoracic region: Secondary | ICD-10-CM | POA: Diagnosis not present

## 2022-09-24 DIAGNOSIS — M47812 Spondylosis without myelopathy or radiculopathy, cervical region: Secondary | ICD-10-CM | POA: Diagnosis not present

## 2022-09-27 DIAGNOSIS — M9903 Segmental and somatic dysfunction of lumbar region: Secondary | ICD-10-CM | POA: Diagnosis not present

## 2022-09-27 DIAGNOSIS — M47812 Spondylosis without myelopathy or radiculopathy, cervical region: Secondary | ICD-10-CM | POA: Diagnosis not present

## 2022-09-27 DIAGNOSIS — S233XXA Sprain of ligaments of thoracic spine, initial encounter: Secondary | ICD-10-CM | POA: Diagnosis not present

## 2022-09-27 DIAGNOSIS — M9902 Segmental and somatic dysfunction of thoracic region: Secondary | ICD-10-CM | POA: Diagnosis not present

## 2022-09-27 DIAGNOSIS — M9901 Segmental and somatic dysfunction of cervical region: Secondary | ICD-10-CM | POA: Diagnosis not present

## 2022-10-02 DIAGNOSIS — M47812 Spondylosis without myelopathy or radiculopathy, cervical region: Secondary | ICD-10-CM | POA: Diagnosis not present

## 2022-10-02 DIAGNOSIS — M47816 Spondylosis without myelopathy or radiculopathy, lumbar region: Secondary | ICD-10-CM | POA: Diagnosis not present

## 2022-10-02 DIAGNOSIS — S233XXA Sprain of ligaments of thoracic spine, initial encounter: Secondary | ICD-10-CM | POA: Diagnosis not present

## 2022-10-02 DIAGNOSIS — M9903 Segmental and somatic dysfunction of lumbar region: Secondary | ICD-10-CM | POA: Diagnosis not present

## 2022-10-02 DIAGNOSIS — M9901 Segmental and somatic dysfunction of cervical region: Secondary | ICD-10-CM | POA: Diagnosis not present

## 2022-10-02 DIAGNOSIS — M545 Low back pain, unspecified: Secondary | ICD-10-CM | POA: Diagnosis not present

## 2022-10-02 DIAGNOSIS — M9902 Segmental and somatic dysfunction of thoracic region: Secondary | ICD-10-CM | POA: Diagnosis not present

## 2022-10-09 DIAGNOSIS — M545 Low back pain, unspecified: Secondary | ICD-10-CM | POA: Diagnosis not present

## 2022-10-09 DIAGNOSIS — M9901 Segmental and somatic dysfunction of cervical region: Secondary | ICD-10-CM | POA: Diagnosis not present

## 2022-10-09 DIAGNOSIS — S233XXA Sprain of ligaments of thoracic spine, initial encounter: Secondary | ICD-10-CM | POA: Diagnosis not present

## 2022-10-09 DIAGNOSIS — M47816 Spondylosis without myelopathy or radiculopathy, lumbar region: Secondary | ICD-10-CM | POA: Diagnosis not present

## 2022-10-09 DIAGNOSIS — M9902 Segmental and somatic dysfunction of thoracic region: Secondary | ICD-10-CM | POA: Diagnosis not present

## 2022-10-09 DIAGNOSIS — M9903 Segmental and somatic dysfunction of lumbar region: Secondary | ICD-10-CM | POA: Diagnosis not present

## 2022-10-09 DIAGNOSIS — M47812 Spondylosis without myelopathy or radiculopathy, cervical region: Secondary | ICD-10-CM | POA: Diagnosis not present

## 2022-10-23 DIAGNOSIS — M9903 Segmental and somatic dysfunction of lumbar region: Secondary | ICD-10-CM | POA: Diagnosis not present

## 2022-10-23 DIAGNOSIS — M9901 Segmental and somatic dysfunction of cervical region: Secondary | ICD-10-CM | POA: Diagnosis not present

## 2022-10-23 DIAGNOSIS — M545 Low back pain, unspecified: Secondary | ICD-10-CM | POA: Diagnosis not present

## 2022-10-23 DIAGNOSIS — M9902 Segmental and somatic dysfunction of thoracic region: Secondary | ICD-10-CM | POA: Diagnosis not present

## 2022-10-23 DIAGNOSIS — S233XXA Sprain of ligaments of thoracic spine, initial encounter: Secondary | ICD-10-CM | POA: Diagnosis not present

## 2022-10-23 DIAGNOSIS — M47812 Spondylosis without myelopathy or radiculopathy, cervical region: Secondary | ICD-10-CM | POA: Diagnosis not present

## 2022-10-23 DIAGNOSIS — M47816 Spondylosis without myelopathy or radiculopathy, lumbar region: Secondary | ICD-10-CM | POA: Diagnosis not present

## 2022-11-06 DIAGNOSIS — M9903 Segmental and somatic dysfunction of lumbar region: Secondary | ICD-10-CM | POA: Diagnosis not present

## 2022-11-06 DIAGNOSIS — M545 Low back pain, unspecified: Secondary | ICD-10-CM | POA: Diagnosis not present

## 2022-11-06 DIAGNOSIS — M47812 Spondylosis without myelopathy or radiculopathy, cervical region: Secondary | ICD-10-CM | POA: Diagnosis not present

## 2022-11-06 DIAGNOSIS — M47816 Spondylosis without myelopathy or radiculopathy, lumbar region: Secondary | ICD-10-CM | POA: Diagnosis not present

## 2022-11-06 DIAGNOSIS — M9902 Segmental and somatic dysfunction of thoracic region: Secondary | ICD-10-CM | POA: Diagnosis not present

## 2022-11-06 DIAGNOSIS — S233XXA Sprain of ligaments of thoracic spine, initial encounter: Secondary | ICD-10-CM | POA: Diagnosis not present

## 2022-11-06 DIAGNOSIS — M9901 Segmental and somatic dysfunction of cervical region: Secondary | ICD-10-CM | POA: Diagnosis not present

## 2022-11-21 DIAGNOSIS — M9901 Segmental and somatic dysfunction of cervical region: Secondary | ICD-10-CM | POA: Diagnosis not present

## 2022-11-21 DIAGNOSIS — M545 Low back pain, unspecified: Secondary | ICD-10-CM | POA: Diagnosis not present

## 2022-11-21 DIAGNOSIS — S233XXA Sprain of ligaments of thoracic spine, initial encounter: Secondary | ICD-10-CM | POA: Diagnosis not present

## 2022-11-21 DIAGNOSIS — M9902 Segmental and somatic dysfunction of thoracic region: Secondary | ICD-10-CM | POA: Diagnosis not present

## 2022-11-21 DIAGNOSIS — M47812 Spondylosis without myelopathy or radiculopathy, cervical region: Secondary | ICD-10-CM | POA: Diagnosis not present

## 2022-11-21 DIAGNOSIS — M9903 Segmental and somatic dysfunction of lumbar region: Secondary | ICD-10-CM | POA: Diagnosis not present

## 2022-11-21 DIAGNOSIS — M47816 Spondylosis without myelopathy or radiculopathy, lumbar region: Secondary | ICD-10-CM | POA: Diagnosis not present

## 2022-12-04 DIAGNOSIS — M9901 Segmental and somatic dysfunction of cervical region: Secondary | ICD-10-CM | POA: Diagnosis not present

## 2022-12-04 DIAGNOSIS — M545 Low back pain, unspecified: Secondary | ICD-10-CM | POA: Diagnosis not present

## 2022-12-04 DIAGNOSIS — M9902 Segmental and somatic dysfunction of thoracic region: Secondary | ICD-10-CM | POA: Diagnosis not present

## 2022-12-04 DIAGNOSIS — M47812 Spondylosis without myelopathy or radiculopathy, cervical region: Secondary | ICD-10-CM | POA: Diagnosis not present

## 2022-12-04 DIAGNOSIS — M47816 Spondylosis without myelopathy or radiculopathy, lumbar region: Secondary | ICD-10-CM | POA: Diagnosis not present

## 2022-12-04 DIAGNOSIS — S233XXA Sprain of ligaments of thoracic spine, initial encounter: Secondary | ICD-10-CM | POA: Diagnosis not present

## 2022-12-04 DIAGNOSIS — M9903 Segmental and somatic dysfunction of lumbar region: Secondary | ICD-10-CM | POA: Diagnosis not present

## 2022-12-18 DIAGNOSIS — M9901 Segmental and somatic dysfunction of cervical region: Secondary | ICD-10-CM | POA: Diagnosis not present

## 2022-12-18 DIAGNOSIS — M545 Low back pain, unspecified: Secondary | ICD-10-CM | POA: Diagnosis not present

## 2022-12-18 DIAGNOSIS — S233XXA Sprain of ligaments of thoracic spine, initial encounter: Secondary | ICD-10-CM | POA: Diagnosis not present

## 2022-12-18 DIAGNOSIS — M9902 Segmental and somatic dysfunction of thoracic region: Secondary | ICD-10-CM | POA: Diagnosis not present

## 2022-12-18 DIAGNOSIS — M47816 Spondylosis without myelopathy or radiculopathy, lumbar region: Secondary | ICD-10-CM | POA: Diagnosis not present

## 2022-12-18 DIAGNOSIS — M9903 Segmental and somatic dysfunction of lumbar region: Secondary | ICD-10-CM | POA: Diagnosis not present

## 2022-12-18 DIAGNOSIS — M47812 Spondylosis without myelopathy or radiculopathy, cervical region: Secondary | ICD-10-CM | POA: Diagnosis not present

## 2023-01-01 DIAGNOSIS — M9901 Segmental and somatic dysfunction of cervical region: Secondary | ICD-10-CM | POA: Diagnosis not present

## 2023-01-01 DIAGNOSIS — M9903 Segmental and somatic dysfunction of lumbar region: Secondary | ICD-10-CM | POA: Diagnosis not present

## 2023-01-01 DIAGNOSIS — M9902 Segmental and somatic dysfunction of thoracic region: Secondary | ICD-10-CM | POA: Diagnosis not present

## 2023-01-01 DIAGNOSIS — S233XXA Sprain of ligaments of thoracic spine, initial encounter: Secondary | ICD-10-CM | POA: Diagnosis not present

## 2023-01-01 DIAGNOSIS — M47816 Spondylosis without myelopathy or radiculopathy, lumbar region: Secondary | ICD-10-CM | POA: Diagnosis not present

## 2023-01-01 DIAGNOSIS — M545 Low back pain, unspecified: Secondary | ICD-10-CM | POA: Diagnosis not present

## 2023-01-01 DIAGNOSIS — M47812 Spondylosis without myelopathy or radiculopathy, cervical region: Secondary | ICD-10-CM | POA: Diagnosis not present

## 2023-01-15 DIAGNOSIS — M545 Low back pain, unspecified: Secondary | ICD-10-CM | POA: Diagnosis not present

## 2023-01-15 DIAGNOSIS — M47816 Spondylosis without myelopathy or radiculopathy, lumbar region: Secondary | ICD-10-CM | POA: Diagnosis not present

## 2023-01-15 DIAGNOSIS — M9901 Segmental and somatic dysfunction of cervical region: Secondary | ICD-10-CM | POA: Diagnosis not present

## 2023-01-15 DIAGNOSIS — M47812 Spondylosis without myelopathy or radiculopathy, cervical region: Secondary | ICD-10-CM | POA: Diagnosis not present

## 2023-01-15 DIAGNOSIS — M9902 Segmental and somatic dysfunction of thoracic region: Secondary | ICD-10-CM | POA: Diagnosis not present

## 2023-01-15 DIAGNOSIS — M9903 Segmental and somatic dysfunction of lumbar region: Secondary | ICD-10-CM | POA: Diagnosis not present

## 2023-01-15 DIAGNOSIS — S233XXA Sprain of ligaments of thoracic spine, initial encounter: Secondary | ICD-10-CM | POA: Diagnosis not present

## 2023-01-29 DIAGNOSIS — M9903 Segmental and somatic dysfunction of lumbar region: Secondary | ICD-10-CM | POA: Diagnosis not present

## 2023-01-29 DIAGNOSIS — M545 Low back pain, unspecified: Secondary | ICD-10-CM | POA: Diagnosis not present

## 2023-01-29 DIAGNOSIS — M9901 Segmental and somatic dysfunction of cervical region: Secondary | ICD-10-CM | POA: Diagnosis not present

## 2023-01-29 DIAGNOSIS — M9902 Segmental and somatic dysfunction of thoracic region: Secondary | ICD-10-CM | POA: Diagnosis not present

## 2023-01-29 DIAGNOSIS — S233XXA Sprain of ligaments of thoracic spine, initial encounter: Secondary | ICD-10-CM | POA: Diagnosis not present

## 2023-01-29 DIAGNOSIS — M47816 Spondylosis without myelopathy or radiculopathy, lumbar region: Secondary | ICD-10-CM | POA: Diagnosis not present

## 2023-01-29 DIAGNOSIS — M47812 Spondylosis without myelopathy or radiculopathy, cervical region: Secondary | ICD-10-CM | POA: Diagnosis not present

## 2023-02-05 DIAGNOSIS — D692 Other nonthrombocytopenic purpura: Secondary | ICD-10-CM | POA: Diagnosis not present

## 2023-02-05 DIAGNOSIS — E7849 Other hyperlipidemia: Secondary | ICD-10-CM | POA: Diagnosis not present

## 2023-02-05 DIAGNOSIS — E559 Vitamin D deficiency, unspecified: Secondary | ICD-10-CM | POA: Diagnosis not present

## 2023-02-05 DIAGNOSIS — Z0000 Encounter for general adult medical examination without abnormal findings: Secondary | ICD-10-CM | POA: Diagnosis not present

## 2023-02-05 DIAGNOSIS — M542 Cervicalgia: Secondary | ICD-10-CM | POA: Diagnosis not present

## 2023-02-05 DIAGNOSIS — E1159 Type 2 diabetes mellitus with other circulatory complications: Secondary | ICD-10-CM | POA: Diagnosis not present

## 2023-02-05 DIAGNOSIS — Z6821 Body mass index (BMI) 21.0-21.9, adult: Secondary | ICD-10-CM | POA: Diagnosis not present

## 2023-02-05 DIAGNOSIS — I10 Essential (primary) hypertension: Secondary | ICD-10-CM | POA: Diagnosis not present

## 2023-02-12 DIAGNOSIS — M47812 Spondylosis without myelopathy or radiculopathy, cervical region: Secondary | ICD-10-CM | POA: Diagnosis not present

## 2023-02-12 DIAGNOSIS — S233XXA Sprain of ligaments of thoracic spine, initial encounter: Secondary | ICD-10-CM | POA: Diagnosis not present

## 2023-02-12 DIAGNOSIS — M545 Low back pain, unspecified: Secondary | ICD-10-CM | POA: Diagnosis not present

## 2023-02-12 DIAGNOSIS — M47816 Spondylosis without myelopathy or radiculopathy, lumbar region: Secondary | ICD-10-CM | POA: Diagnosis not present

## 2023-02-12 DIAGNOSIS — M9903 Segmental and somatic dysfunction of lumbar region: Secondary | ICD-10-CM | POA: Diagnosis not present

## 2023-02-12 DIAGNOSIS — M9901 Segmental and somatic dysfunction of cervical region: Secondary | ICD-10-CM | POA: Diagnosis not present

## 2023-02-12 DIAGNOSIS — M9902 Segmental and somatic dysfunction of thoracic region: Secondary | ICD-10-CM | POA: Diagnosis not present

## 2023-02-21 DIAGNOSIS — L820 Inflamed seborrheic keratosis: Secondary | ICD-10-CM | POA: Diagnosis not present

## 2023-02-21 DIAGNOSIS — X32XXXA Exposure to sunlight, initial encounter: Secondary | ICD-10-CM | POA: Diagnosis not present

## 2023-02-21 DIAGNOSIS — L570 Actinic keratosis: Secondary | ICD-10-CM | POA: Diagnosis not present

## 2023-02-21 DIAGNOSIS — L720 Epidermal cyst: Secondary | ICD-10-CM | POA: Diagnosis not present

## 2023-03-15 DIAGNOSIS — Z87891 Personal history of nicotine dependence: Secondary | ICD-10-CM | POA: Diagnosis not present

## 2023-03-15 DIAGNOSIS — I2510 Atherosclerotic heart disease of native coronary artery without angina pectoris: Secondary | ICD-10-CM | POA: Diagnosis not present

## 2023-03-15 DIAGNOSIS — I6782 Cerebral ischemia: Secondary | ICD-10-CM | POA: Diagnosis not present

## 2023-03-15 DIAGNOSIS — R471 Dysarthria and anarthria: Secondary | ICD-10-CM | POA: Diagnosis not present

## 2023-03-15 DIAGNOSIS — G459 Transient cerebral ischemic attack, unspecified: Secondary | ICD-10-CM | POA: Diagnosis not present

## 2023-03-15 DIAGNOSIS — I219 Acute myocardial infarction, unspecified: Secondary | ICD-10-CM | POA: Diagnosis not present

## 2023-03-15 DIAGNOSIS — E119 Type 2 diabetes mellitus without complications: Secondary | ICD-10-CM | POA: Diagnosis not present

## 2023-03-15 DIAGNOSIS — Z91041 Radiographic dye allergy status: Secondary | ICD-10-CM | POA: Diagnosis not present

## 2023-03-18 DIAGNOSIS — Z6821 Body mass index (BMI) 21.0-21.9, adult: Secondary | ICD-10-CM | POA: Diagnosis not present

## 2023-03-18 DIAGNOSIS — E7849 Other hyperlipidemia: Secondary | ICD-10-CM | POA: Diagnosis not present

## 2023-03-18 DIAGNOSIS — G458 Other transient cerebral ischemic attacks and related syndromes: Secondary | ICD-10-CM | POA: Diagnosis not present

## 2023-03-27 DIAGNOSIS — Z6821 Body mass index (BMI) 21.0-21.9, adult: Secondary | ICD-10-CM | POA: Diagnosis not present

## 2023-03-27 DIAGNOSIS — I83223 Varicose veins of left lower extremity with both ulcer of ankle and inflammation: Secondary | ICD-10-CM | POA: Diagnosis not present

## 2023-03-27 DIAGNOSIS — I10 Essential (primary) hypertension: Secondary | ICD-10-CM | POA: Diagnosis not present

## 2023-03-28 DIAGNOSIS — I6523 Occlusion and stenosis of bilateral carotid arteries: Secondary | ICD-10-CM | POA: Diagnosis not present

## 2023-03-28 DIAGNOSIS — G459 Transient cerebral ischemic attack, unspecified: Secondary | ICD-10-CM | POA: Diagnosis not present

## 2023-04-03 DIAGNOSIS — H04123 Dry eye syndrome of bilateral lacrimal glands: Secondary | ICD-10-CM | POA: Diagnosis not present

## 2023-04-03 DIAGNOSIS — H2511 Age-related nuclear cataract, right eye: Secondary | ICD-10-CM | POA: Diagnosis not present

## 2023-04-03 DIAGNOSIS — H40053 Ocular hypertension, bilateral: Secondary | ICD-10-CM | POA: Diagnosis not present

## 2023-04-03 DIAGNOSIS — E119 Type 2 diabetes mellitus without complications: Secondary | ICD-10-CM | POA: Diagnosis not present

## 2023-04-03 DIAGNOSIS — H2702 Aphakia, left eye: Secondary | ICD-10-CM | POA: Diagnosis not present

## 2023-05-03 DIAGNOSIS — H25811 Combined forms of age-related cataract, right eye: Secondary | ICD-10-CM | POA: Diagnosis not present

## 2023-05-03 DIAGNOSIS — H2189 Other specified disorders of iris and ciliary body: Secondary | ICD-10-CM | POA: Diagnosis not present

## 2023-05-03 DIAGNOSIS — H52221 Regular astigmatism, right eye: Secondary | ICD-10-CM | POA: Diagnosis not present

## 2023-06-25 DIAGNOSIS — S233XXA Sprain of ligaments of thoracic spine, initial encounter: Secondary | ICD-10-CM | POA: Diagnosis not present

## 2023-06-25 DIAGNOSIS — M545 Low back pain, unspecified: Secondary | ICD-10-CM | POA: Diagnosis not present

## 2023-06-25 DIAGNOSIS — M9902 Segmental and somatic dysfunction of thoracic region: Secondary | ICD-10-CM | POA: Diagnosis not present

## 2023-06-25 DIAGNOSIS — M9903 Segmental and somatic dysfunction of lumbar region: Secondary | ICD-10-CM | POA: Diagnosis not present

## 2023-06-25 DIAGNOSIS — M9901 Segmental and somatic dysfunction of cervical region: Secondary | ICD-10-CM | POA: Diagnosis not present

## 2023-06-25 DIAGNOSIS — M47812 Spondylosis without myelopathy or radiculopathy, cervical region: Secondary | ICD-10-CM | POA: Diagnosis not present

## 2023-06-25 DIAGNOSIS — M47816 Spondylosis without myelopathy or radiculopathy, lumbar region: Secondary | ICD-10-CM | POA: Diagnosis not present

## 2023-07-23 DIAGNOSIS — M47812 Spondylosis without myelopathy or radiculopathy, cervical region: Secondary | ICD-10-CM | POA: Diagnosis not present

## 2023-07-23 DIAGNOSIS — M9903 Segmental and somatic dysfunction of lumbar region: Secondary | ICD-10-CM | POA: Diagnosis not present

## 2023-07-23 DIAGNOSIS — M9901 Segmental and somatic dysfunction of cervical region: Secondary | ICD-10-CM | POA: Diagnosis not present

## 2023-07-23 DIAGNOSIS — M9902 Segmental and somatic dysfunction of thoracic region: Secondary | ICD-10-CM | POA: Diagnosis not present

## 2023-07-23 DIAGNOSIS — M545 Low back pain, unspecified: Secondary | ICD-10-CM | POA: Diagnosis not present

## 2023-07-23 DIAGNOSIS — S233XXA Sprain of ligaments of thoracic spine, initial encounter: Secondary | ICD-10-CM | POA: Diagnosis not present

## 2023-07-23 DIAGNOSIS — M47816 Spondylosis without myelopathy or radiculopathy, lumbar region: Secondary | ICD-10-CM | POA: Diagnosis not present

## 2023-07-25 DIAGNOSIS — Z961 Presence of intraocular lens: Secondary | ICD-10-CM | POA: Diagnosis not present

## 2023-07-25 DIAGNOSIS — H2702 Aphakia, left eye: Secondary | ICD-10-CM | POA: Diagnosis not present

## 2023-08-06 DIAGNOSIS — M542 Cervicalgia: Secondary | ICD-10-CM | POA: Diagnosis not present

## 2023-08-06 DIAGNOSIS — D692 Other nonthrombocytopenic purpura: Secondary | ICD-10-CM | POA: Diagnosis not present

## 2023-08-06 DIAGNOSIS — Z6821 Body mass index (BMI) 21.0-21.9, adult: Secondary | ICD-10-CM | POA: Diagnosis not present

## 2023-08-06 DIAGNOSIS — E7849 Other hyperlipidemia: Secondary | ICD-10-CM | POA: Diagnosis not present

## 2023-08-06 DIAGNOSIS — E1159 Type 2 diabetes mellitus with other circulatory complications: Secondary | ICD-10-CM | POA: Diagnosis not present

## 2023-08-06 DIAGNOSIS — I10 Essential (primary) hypertension: Secondary | ICD-10-CM | POA: Diagnosis not present

## 2023-08-06 DIAGNOSIS — Z0000 Encounter for general adult medical examination without abnormal findings: Secondary | ICD-10-CM | POA: Diagnosis not present

## 2023-08-06 DIAGNOSIS — I2510 Atherosclerotic heart disease of native coronary artery without angina pectoris: Secondary | ICD-10-CM | POA: Diagnosis not present

## 2023-08-06 DIAGNOSIS — Z8673 Personal history of transient ischemic attack (TIA), and cerebral infarction without residual deficits: Secondary | ICD-10-CM | POA: Diagnosis not present

## 2023-08-20 DIAGNOSIS — S233XXA Sprain of ligaments of thoracic spine, initial encounter: Secondary | ICD-10-CM | POA: Diagnosis not present

## 2023-08-20 DIAGNOSIS — M47816 Spondylosis without myelopathy or radiculopathy, lumbar region: Secondary | ICD-10-CM | POA: Diagnosis not present

## 2023-08-20 DIAGNOSIS — M9903 Segmental and somatic dysfunction of lumbar region: Secondary | ICD-10-CM | POA: Diagnosis not present

## 2023-08-20 DIAGNOSIS — M47812 Spondylosis without myelopathy or radiculopathy, cervical region: Secondary | ICD-10-CM | POA: Diagnosis not present

## 2023-08-20 DIAGNOSIS — M545 Low back pain, unspecified: Secondary | ICD-10-CM | POA: Diagnosis not present

## 2023-08-20 DIAGNOSIS — M9902 Segmental and somatic dysfunction of thoracic region: Secondary | ICD-10-CM | POA: Diagnosis not present

## 2023-08-20 DIAGNOSIS — M9901 Segmental and somatic dysfunction of cervical region: Secondary | ICD-10-CM | POA: Diagnosis not present

## 2023-11-25 DIAGNOSIS — S40211A Abrasion of right shoulder, initial encounter: Secondary | ICD-10-CM | POA: Diagnosis not present

## 2023-11-25 DIAGNOSIS — I10 Essential (primary) hypertension: Secondary | ICD-10-CM | POA: Diagnosis not present

## 2023-11-25 DIAGNOSIS — J90 Pleural effusion, not elsewhere classified: Secondary | ICD-10-CM | POA: Diagnosis not present

## 2023-11-25 DIAGNOSIS — R488 Other symbolic dysfunctions: Secondary | ICD-10-CM | POA: Diagnosis not present

## 2023-11-25 DIAGNOSIS — Y92096 Garden or yard of other non-institutional residence as the place of occurrence of the external cause: Secondary | ICD-10-CM | POA: Diagnosis not present

## 2023-11-25 DIAGNOSIS — E278 Other specified disorders of adrenal gland: Secondary | ICD-10-CM | POA: Diagnosis not present

## 2023-11-25 DIAGNOSIS — Z9181 History of falling: Secondary | ICD-10-CM | POA: Diagnosis not present

## 2023-11-25 DIAGNOSIS — R6889 Other general symptoms and signs: Secondary | ICD-10-CM | POA: Diagnosis not present

## 2023-11-25 DIAGNOSIS — S199XXA Unspecified injury of neck, initial encounter: Secondary | ICD-10-CM | POA: Diagnosis not present

## 2023-11-25 DIAGNOSIS — E119 Type 2 diabetes mellitus without complications: Secondary | ICD-10-CM | POA: Diagnosis not present

## 2023-11-25 DIAGNOSIS — R579 Shock, unspecified: Secondary | ICD-10-CM | POA: Diagnosis not present

## 2023-11-25 DIAGNOSIS — J9811 Atelectasis: Secondary | ICD-10-CM | POA: Diagnosis not present

## 2023-11-25 DIAGNOSIS — J8489 Other specified interstitial pulmonary diseases: Secondary | ICD-10-CM | POA: Diagnosis not present

## 2023-11-25 DIAGNOSIS — E785 Hyperlipidemia, unspecified: Secondary | ICD-10-CM | POA: Diagnosis not present

## 2023-11-25 DIAGNOSIS — I493 Ventricular premature depolarization: Secondary | ICD-10-CM | POA: Diagnosis not present

## 2023-11-25 DIAGNOSIS — S22038D Other fracture of third thoracic vertebra, subsequent encounter for fracture with routine healing: Secondary | ICD-10-CM | POA: Diagnosis not present

## 2023-11-25 DIAGNOSIS — R000 Tachycardia, unspecified: Secondary | ICD-10-CM | POA: Diagnosis not present

## 2023-11-25 DIAGNOSIS — S22049A Unspecified fracture of fourth thoracic vertebra, initial encounter for closed fracture: Secondary | ICD-10-CM | POA: Diagnosis not present

## 2023-11-25 DIAGNOSIS — S41112A Laceration without foreign body of left upper arm, initial encounter: Secondary | ICD-10-CM | POA: Diagnosis not present

## 2023-11-25 DIAGNOSIS — S13120A Subluxation of C1/C2 cervical vertebrae, initial encounter: Secondary | ICD-10-CM | POA: Diagnosis not present

## 2023-11-25 DIAGNOSIS — I2510 Atherosclerotic heart disease of native coronary artery without angina pectoris: Secondary | ICD-10-CM | POA: Diagnosis not present

## 2023-11-25 DIAGNOSIS — J9311 Primary spontaneous pneumothorax: Secondary | ICD-10-CM | POA: Diagnosis not present

## 2023-11-25 DIAGNOSIS — S2241XA Multiple fractures of ribs, right side, initial encounter for closed fracture: Secondary | ICD-10-CM | POA: Diagnosis not present

## 2023-11-25 DIAGNOSIS — S37812A Contusion of adrenal gland, initial encounter: Secondary | ICD-10-CM | POA: Diagnosis not present

## 2023-11-25 DIAGNOSIS — S22089S Unspecified fracture of T11-T12 vertebra, sequela: Secondary | ICD-10-CM | POA: Diagnosis not present

## 2023-11-25 DIAGNOSIS — S32048A Other fracture of fourth lumbar vertebra, initial encounter for closed fracture: Secondary | ICD-10-CM | POA: Diagnosis not present

## 2023-11-25 DIAGNOSIS — Z978 Presence of other specified devices: Secondary | ICD-10-CM | POA: Diagnosis not present

## 2023-11-25 DIAGNOSIS — S32048D Other fracture of fourth lumbar vertebra, subsequent encounter for fracture with routine healing: Secondary | ICD-10-CM | POA: Diagnosis not present

## 2023-11-25 DIAGNOSIS — S2243XA Multiple fractures of ribs, bilateral, initial encounter for closed fracture: Secondary | ICD-10-CM | POA: Diagnosis not present

## 2023-11-25 DIAGNOSIS — M2578 Osteophyte, vertebrae: Secondary | ICD-10-CM | POA: Diagnosis not present

## 2023-11-25 DIAGNOSIS — S270XXA Traumatic pneumothorax, initial encounter: Secondary | ICD-10-CM | POA: Diagnosis not present

## 2023-11-25 DIAGNOSIS — W230XXA Caught, crushed, jammed, or pinched between moving objects, initial encounter: Secondary | ICD-10-CM | POA: Diagnosis not present

## 2023-11-25 DIAGNOSIS — S0003XA Contusion of scalp, initial encounter: Secondary | ICD-10-CM | POA: Diagnosis not present

## 2023-11-25 DIAGNOSIS — S32110D Nondisplaced Zone I fracture of sacrum, subsequent encounter for fracture with routine healing: Secondary | ICD-10-CM | POA: Diagnosis not present

## 2023-11-25 DIAGNOSIS — S22048A Other fracture of fourth thoracic vertebra, initial encounter for closed fracture: Secondary | ICD-10-CM | POA: Diagnosis not present

## 2023-11-25 DIAGNOSIS — R578 Other shock: Secondary | ICD-10-CM | POA: Diagnosis not present

## 2023-11-25 DIAGNOSIS — S301XXA Contusion of abdominal wall, initial encounter: Secondary | ICD-10-CM | POA: Diagnosis not present

## 2023-11-25 DIAGNOSIS — S3600XA Unspecified injury of spleen, initial encounter: Secondary | ICD-10-CM | POA: Diagnosis not present

## 2023-11-25 DIAGNOSIS — S32058D Other fracture of fifth lumbar vertebra, subsequent encounter for fracture with routine healing: Secondary | ICD-10-CM | POA: Diagnosis not present

## 2023-11-25 DIAGNOSIS — I4891 Unspecified atrial fibrillation: Secondary | ICD-10-CM | POA: Diagnosis not present

## 2023-11-25 DIAGNOSIS — S36116A Major laceration of liver, initial encounter: Secondary | ICD-10-CM | POA: Diagnosis not present

## 2023-11-25 DIAGNOSIS — S32058A Other fracture of fifth lumbar vertebra, initial encounter for closed fracture: Secondary | ICD-10-CM | POA: Diagnosis not present

## 2023-11-25 DIAGNOSIS — Z960 Presence of urogenital implants: Secondary | ICD-10-CM | POA: Diagnosis not present

## 2023-11-25 DIAGNOSIS — I499 Cardiac arrhythmia, unspecified: Secondary | ICD-10-CM | POA: Diagnosis not present

## 2023-11-25 DIAGNOSIS — S22089A Unspecified fracture of T11-T12 vertebra, initial encounter for closed fracture: Secondary | ICD-10-CM | POA: Diagnosis not present

## 2023-11-25 DIAGNOSIS — S27321A Contusion of lung, unilateral, initial encounter: Secondary | ICD-10-CM | POA: Diagnosis not present

## 2023-11-25 DIAGNOSIS — X58XXXA Exposure to other specified factors, initial encounter: Secondary | ICD-10-CM | POA: Diagnosis not present

## 2023-11-25 DIAGNOSIS — Z9989 Dependence on other enabling machines and devices: Secondary | ICD-10-CM | POA: Diagnosis not present

## 2023-11-25 DIAGNOSIS — S32049A Unspecified fracture of fourth lumbar vertebra, initial encounter for closed fracture: Secondary | ICD-10-CM | POA: Diagnosis not present

## 2023-11-25 DIAGNOSIS — S2242XA Multiple fractures of ribs, left side, initial encounter for closed fracture: Secondary | ICD-10-CM | POA: Diagnosis not present

## 2023-11-25 DIAGNOSIS — S2242XD Multiple fractures of ribs, left side, subsequent encounter for fracture with routine healing: Secondary | ICD-10-CM | POA: Diagnosis not present

## 2023-11-25 DIAGNOSIS — S36116D Major laceration of liver, subsequent encounter: Secondary | ICD-10-CM | POA: Diagnosis not present

## 2023-11-25 DIAGNOSIS — Z4682 Encounter for fitting and adjustment of non-vascular catheter: Secondary | ICD-10-CM | POA: Diagnosis not present

## 2023-11-25 DIAGNOSIS — S300XXA Contusion of lower back and pelvis, initial encounter: Secondary | ICD-10-CM | POA: Diagnosis not present

## 2023-11-25 DIAGNOSIS — S22039A Unspecified fracture of third thoracic vertebra, initial encounter for closed fracture: Secondary | ICD-10-CM | POA: Diagnosis not present

## 2023-11-25 DIAGNOSIS — S22048D Other fracture of fourth thoracic vertebra, subsequent encounter for fracture with routine healing: Secondary | ICD-10-CM | POA: Diagnosis not present

## 2023-11-25 DIAGNOSIS — S36113A Laceration of liver, unspecified degree, initial encounter: Secondary | ICD-10-CM | POA: Diagnosis not present

## 2023-11-25 DIAGNOSIS — S32042A Unstable burst fracture of fourth lumbar vertebra, initial encounter for closed fracture: Secondary | ICD-10-CM | POA: Diagnosis not present

## 2023-11-25 DIAGNOSIS — M6281 Muscle weakness (generalized): Secondary | ICD-10-CM | POA: Diagnosis not present

## 2023-11-25 DIAGNOSIS — G8911 Acute pain due to trauma: Secondary | ICD-10-CM | POA: Diagnosis not present

## 2023-11-25 DIAGNOSIS — S3210XA Unspecified fracture of sacrum, initial encounter for closed fracture: Secondary | ICD-10-CM | POA: Diagnosis not present

## 2023-11-25 DIAGNOSIS — S280XXA Crushed chest, initial encounter: Secondary | ICD-10-CM | POA: Diagnosis not present

## 2023-11-25 DIAGNOSIS — M87052 Idiopathic aseptic necrosis of left femur: Secondary | ICD-10-CM | POA: Diagnosis not present

## 2023-11-25 DIAGNOSIS — R262 Difficulty in walking, not elsewhere classified: Secondary | ICD-10-CM | POA: Diagnosis not present

## 2023-11-25 DIAGNOSIS — J939 Pneumothorax, unspecified: Secondary | ICD-10-CM | POA: Diagnosis not present

## 2023-11-25 DIAGNOSIS — R498 Other voice and resonance disorders: Secondary | ICD-10-CM | POA: Diagnosis not present

## 2023-11-25 DIAGNOSIS — Z043 Encounter for examination and observation following other accident: Secondary | ICD-10-CM | POA: Diagnosis not present

## 2023-11-25 DIAGNOSIS — S32059A Unspecified fracture of fifth lumbar vertebra, initial encounter for closed fracture: Secondary | ICD-10-CM | POA: Diagnosis not present

## 2023-11-25 DIAGNOSIS — S32110A Nondisplaced Zone I fracture of sacrum, initial encounter for closed fracture: Secondary | ICD-10-CM | POA: Diagnosis not present

## 2023-11-25 DIAGNOSIS — I6529 Occlusion and stenosis of unspecified carotid artery: Secondary | ICD-10-CM | POA: Diagnosis not present

## 2023-11-25 DIAGNOSIS — S2241XD Multiple fractures of ribs, right side, subsequent encounter for fracture with routine healing: Secondary | ICD-10-CM | POA: Diagnosis not present

## 2023-11-25 DIAGNOSIS — S32111A Minimally displaced Zone I fracture of sacrum, initial encounter for closed fracture: Secondary | ICD-10-CM | POA: Diagnosis not present

## 2023-11-25 DIAGNOSIS — I491 Atrial premature depolarization: Secondary | ICD-10-CM | POA: Diagnosis not present

## 2023-11-25 DIAGNOSIS — S36115A Moderate laceration of liver, initial encounter: Secondary | ICD-10-CM | POA: Diagnosis not present

## 2023-11-25 DIAGNOSIS — Z743 Need for continuous supervision: Secondary | ICD-10-CM | POA: Diagnosis not present

## 2023-11-25 DIAGNOSIS — K661 Hemoperitoneum: Secondary | ICD-10-CM | POA: Diagnosis not present

## 2023-11-26 DIAGNOSIS — Z043 Encounter for examination and observation following other accident: Secondary | ICD-10-CM | POA: Diagnosis not present

## 2023-11-26 DIAGNOSIS — Z4682 Encounter for fitting and adjustment of non-vascular catheter: Secondary | ICD-10-CM | POA: Diagnosis not present

## 2023-11-27 DIAGNOSIS — Z4682 Encounter for fitting and adjustment of non-vascular catheter: Secondary | ICD-10-CM | POA: Diagnosis not present

## 2023-11-28 DIAGNOSIS — J939 Pneumothorax, unspecified: Secondary | ICD-10-CM | POA: Diagnosis not present

## 2023-11-29 DIAGNOSIS — J90 Pleural effusion, not elsewhere classified: Secondary | ICD-10-CM | POA: Diagnosis not present

## 2023-12-03 DIAGNOSIS — E1159 Type 2 diabetes mellitus with other circulatory complications: Secondary | ICD-10-CM | POA: Diagnosis not present

## 2023-12-03 DIAGNOSIS — S2239XA Fracture of one rib, unspecified side, initial encounter for closed fracture: Secondary | ICD-10-CM | POA: Diagnosis not present

## 2023-12-03 DIAGNOSIS — S37812S Contusion of adrenal gland, sequela: Secondary | ICD-10-CM | POA: Diagnosis not present

## 2023-12-03 DIAGNOSIS — J939 Pneumothorax, unspecified: Secondary | ICD-10-CM | POA: Diagnosis not present

## 2023-12-03 DIAGNOSIS — S22089S Unspecified fracture of T11-T12 vertebra, sequela: Secondary | ICD-10-CM | POA: Diagnosis not present

## 2023-12-03 DIAGNOSIS — E1169 Type 2 diabetes mellitus with other specified complication: Secondary | ICD-10-CM | POA: Diagnosis not present

## 2023-12-03 DIAGNOSIS — S32058D Other fracture of fifth lumbar vertebra, subsequent encounter for fracture with routine healing: Secondary | ICD-10-CM | POA: Diagnosis not present

## 2023-12-03 DIAGNOSIS — I152 Hypertension secondary to endocrine disorders: Secondary | ICD-10-CM | POA: Diagnosis not present

## 2023-12-03 DIAGNOSIS — R498 Other voice and resonance disorders: Secondary | ICD-10-CM | POA: Diagnosis not present

## 2023-12-03 DIAGNOSIS — S32048D Other fracture of fourth lumbar vertebra, subsequent encounter for fracture with routine healing: Secondary | ICD-10-CM | POA: Diagnosis not present

## 2023-12-03 DIAGNOSIS — M6281 Muscle weakness (generalized): Secondary | ICD-10-CM | POA: Diagnosis not present

## 2023-12-03 DIAGNOSIS — T148XXA Other injury of unspecified body region, initial encounter: Secondary | ICD-10-CM | POA: Diagnosis not present

## 2023-12-03 DIAGNOSIS — Z8673 Personal history of transient ischemic attack (TIA), and cerebral infarction without residual deficits: Secondary | ICD-10-CM | POA: Diagnosis not present

## 2023-12-03 DIAGNOSIS — S36113A Laceration of liver, unspecified degree, initial encounter: Secondary | ICD-10-CM | POA: Diagnosis not present

## 2023-12-03 DIAGNOSIS — S2241XD Multiple fractures of ribs, right side, subsequent encounter for fracture with routine healing: Secondary | ICD-10-CM | POA: Diagnosis not present

## 2023-12-03 DIAGNOSIS — S36116D Major laceration of liver, subsequent encounter: Secondary | ICD-10-CM | POA: Diagnosis not present

## 2023-12-03 DIAGNOSIS — S22048D Other fracture of fourth thoracic vertebra, subsequent encounter for fracture with routine healing: Secondary | ICD-10-CM | POA: Diagnosis not present

## 2023-12-03 DIAGNOSIS — K5909 Other constipation: Secondary | ICD-10-CM | POA: Diagnosis not present

## 2023-12-03 DIAGNOSIS — Z9181 History of falling: Secondary | ICD-10-CM | POA: Diagnosis not present

## 2023-12-03 DIAGNOSIS — S300XXA Contusion of lower back and pelvis, initial encounter: Secondary | ICD-10-CM | POA: Diagnosis not present

## 2023-12-03 DIAGNOSIS — S2242XD Multiple fractures of ribs, left side, subsequent encounter for fracture with routine healing: Secondary | ICD-10-CM | POA: Diagnosis not present

## 2023-12-03 DIAGNOSIS — Z743 Need for continuous supervision: Secondary | ICD-10-CM | POA: Diagnosis not present

## 2023-12-03 DIAGNOSIS — S270XXA Traumatic pneumothorax, initial encounter: Secondary | ICD-10-CM | POA: Diagnosis not present

## 2023-12-03 DIAGNOSIS — R262 Difficulty in walking, not elsewhere classified: Secondary | ICD-10-CM | POA: Diagnosis not present

## 2023-12-03 DIAGNOSIS — R6889 Other general symptoms and signs: Secondary | ICD-10-CM | POA: Diagnosis not present

## 2023-12-03 DIAGNOSIS — K219 Gastro-esophageal reflux disease without esophagitis: Secondary | ICD-10-CM | POA: Diagnosis not present

## 2023-12-03 DIAGNOSIS — H40053 Ocular hypertension, bilateral: Secondary | ICD-10-CM | POA: Diagnosis not present

## 2023-12-03 DIAGNOSIS — S3210XD Unspecified fracture of sacrum, subsequent encounter for fracture with routine healing: Secondary | ICD-10-CM | POA: Diagnosis not present

## 2023-12-03 DIAGNOSIS — S3210XS Unspecified fracture of sacrum, sequela: Secondary | ICD-10-CM | POA: Diagnosis not present

## 2023-12-03 DIAGNOSIS — S22038D Other fracture of third thoracic vertebra, subsequent encounter for fracture with routine healing: Secondary | ICD-10-CM | POA: Diagnosis not present

## 2023-12-03 DIAGNOSIS — E785 Hyperlipidemia, unspecified: Secondary | ICD-10-CM | POA: Diagnosis not present

## 2023-12-03 DIAGNOSIS — S2242XA Multiple fractures of ribs, left side, initial encounter for closed fracture: Secondary | ICD-10-CM | POA: Diagnosis not present

## 2023-12-03 DIAGNOSIS — S32110D Nondisplaced Zone I fracture of sacrum, subsequent encounter for fracture with routine healing: Secondary | ICD-10-CM | POA: Diagnosis not present

## 2023-12-03 DIAGNOSIS — R488 Other symbolic dysfunctions: Secondary | ICD-10-CM | POA: Diagnosis not present

## 2023-12-04 ENCOUNTER — Encounter: Payer: Self-pay | Admitting: Adult Health

## 2023-12-04 ENCOUNTER — Other Ambulatory Visit: Payer: Self-pay | Admitting: Adult Health

## 2023-12-04 ENCOUNTER — Non-Acute Institutional Stay (SKILLED_NURSING_FACILITY): Admitting: Adult Health

## 2023-12-04 DIAGNOSIS — S270XXA Traumatic pneumothorax, initial encounter: Secondary | ICD-10-CM

## 2023-12-04 DIAGNOSIS — S2242XA Multiple fractures of ribs, left side, initial encounter for closed fracture: Secondary | ICD-10-CM | POA: Diagnosis not present

## 2023-12-04 DIAGNOSIS — H40053 Ocular hypertension, bilateral: Secondary | ICD-10-CM | POA: Insufficient documentation

## 2023-12-04 DIAGNOSIS — T148XXA Other injury of unspecified body region, initial encounter: Secondary | ICD-10-CM | POA: Insufficient documentation

## 2023-12-04 DIAGNOSIS — S37812S Contusion of adrenal gland, sequela: Secondary | ICD-10-CM

## 2023-12-04 DIAGNOSIS — K219 Gastro-esophageal reflux disease without esophagitis: Secondary | ICD-10-CM | POA: Diagnosis not present

## 2023-12-04 DIAGNOSIS — E1169 Type 2 diabetes mellitus with other specified complication: Secondary | ICD-10-CM | POA: Insufficient documentation

## 2023-12-04 DIAGNOSIS — N501 Vascular disorders of male genital organs: Secondary | ICD-10-CM | POA: Insufficient documentation

## 2023-12-04 DIAGNOSIS — S3210XS Unspecified fracture of sacrum, sequela: Secondary | ICD-10-CM

## 2023-12-04 DIAGNOSIS — S3210XA Unspecified fracture of sacrum, initial encounter for closed fracture: Secondary | ICD-10-CM | POA: Insufficient documentation

## 2023-12-04 DIAGNOSIS — S300XXA Contusion of lower back and pelvis, initial encounter: Secondary | ICD-10-CM | POA: Insufficient documentation

## 2023-12-04 DIAGNOSIS — K5909 Other constipation: Secondary | ICD-10-CM | POA: Insufficient documentation

## 2023-12-04 DIAGNOSIS — S37812A Contusion of adrenal gland, initial encounter: Secondary | ICD-10-CM | POA: Insufficient documentation

## 2023-12-04 DIAGNOSIS — I2510 Atherosclerotic heart disease of native coronary artery without angina pectoris: Secondary | ICD-10-CM | POA: Insufficient documentation

## 2023-12-04 DIAGNOSIS — E785 Hyperlipidemia, unspecified: Secondary | ICD-10-CM

## 2023-12-04 DIAGNOSIS — I152 Hypertension secondary to endocrine disorders: Secondary | ICD-10-CM

## 2023-12-04 DIAGNOSIS — S2239XA Fracture of one rib, unspecified side, initial encounter for closed fracture: Secondary | ICD-10-CM | POA: Insufficient documentation

## 2023-12-04 DIAGNOSIS — S36113A Laceration of liver, unspecified degree, initial encounter: Secondary | ICD-10-CM

## 2023-12-04 DIAGNOSIS — E1159 Type 2 diabetes mellitus with other circulatory complications: Secondary | ICD-10-CM | POA: Diagnosis not present

## 2023-12-04 MED ORDER — OXYCODONE HCL 5 MG PO TABS: 5.0000 mg | ORAL_TABLET | ORAL | 0 refills | Status: DC | PRN
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
## 2023-12-05 ENCOUNTER — Non-Acute Institutional Stay (SKILLED_NURSING_FACILITY): Payer: Self-pay | Admitting: Internal Medicine

## 2023-12-05 ENCOUNTER — Encounter: Payer: Self-pay | Admitting: Internal Medicine

## 2023-12-05 DIAGNOSIS — S2239XA Fracture of one rib, unspecified side, initial encounter for closed fracture: Secondary | ICD-10-CM | POA: Diagnosis not present

## 2023-12-05 DIAGNOSIS — S2242XA Multiple fractures of ribs, left side, initial encounter for closed fracture: Secondary | ICD-10-CM | POA: Diagnosis not present

## 2023-12-05 DIAGNOSIS — T148XXA Other injury of unspecified body region, initial encounter: Secondary | ICD-10-CM | POA: Diagnosis not present

## 2023-12-05 DIAGNOSIS — E1159 Type 2 diabetes mellitus with other circulatory complications: Secondary | ICD-10-CM

## 2023-12-05 DIAGNOSIS — E785 Hyperlipidemia, unspecified: Secondary | ICD-10-CM | POA: Diagnosis not present

## 2023-12-05 DIAGNOSIS — Z8673 Personal history of transient ischemic attack (TIA), and cerebral infarction without residual deficits: Secondary | ICD-10-CM

## 2023-12-05 DIAGNOSIS — S36113A Laceration of liver, unspecified degree, initial encounter: Secondary | ICD-10-CM

## 2023-12-05 DIAGNOSIS — I152 Hypertension secondary to endocrine disorders: Secondary | ICD-10-CM | POA: Diagnosis not present

## 2023-12-05 DIAGNOSIS — S3210XS Unspecified fracture of sacrum, sequela: Secondary | ICD-10-CM

## 2023-12-05 DIAGNOSIS — E1169 Type 2 diabetes mellitus with other specified complication: Secondary | ICD-10-CM | POA: Diagnosis not present

## 2023-12-05 DIAGNOSIS — S270XXA Traumatic pneumothorax, initial encounter: Secondary | ICD-10-CM | POA: Diagnosis not present

## 2023-12-05 NOTE — Progress Notes (Signed)
 Provider:   Location:      Place of Service:     PCP: Veda Gerald, MD Patient Care Team: Veda Gerald, MD as PCP - General (Internal Medicine) Devon Fogo, MD (Inactive) as Consulting Physician (Dermatology)  Extended Emergency Contact Information Primary Emergency Contact: [place], [place], [place] [address] [place] Home Phone: [phone] Work Phone: [phone] Mobile Phone: [phone] Relation: Son Secondary Emergency Contact: Grubb,Nell Address: [address]          [place], [place] [address] [place] Home Phone: [phone] Work Phone: [phone] Mobile Phone: [phone] Relation: Spouse  Code Status:  Goals of Care: Advanced Directive information    09/20/2021    6:28 AM  Advanced Directives  Does Patient Have a Medical Advance Directive? Yes  Type of Estate agent of [place];Living will  Copy of Healthcare Power of Attorney in Chart? No - copy requested      No chief complaint on file.   HPI: Patient is a 83 y.o. male seen today for admission to  Past Medical History:  Diagnosis Date   Anxiety    Cancer [facility])    Colon cancer   Colon polyps    Depression    Diabetes mellitus without complication (HCC)    GERD (gastroesophageal reflux disease)    Hypercholesteremia    Hypertension    Myocardial infarction (HCC) 1997   Squamous cell carcinoma of skin 09/23/2019   left supraclavicular area-(CX35FU)   Stroke [facility])    Past Surgical History:  Procedure Laterality Date   BIOPSY  09/20/2021   Procedure: BIOPSY;  Surgeon: Ruby Corporal, MD;  [facility];  Service: Endoscopy;;   CARDIAC CATHETERIZATION     CAROTID ANGIOGRAPHY  2019   carotid endartercetomy Left    COLONOSCOPY     COLONOSCOPY N/A 05/27/2015   Procedure: COLONOSCOPY;  Surgeon: Ruby Corporal, MD;  [facility];  Service: Endoscopy;  Laterality: N/A;  1030   COLONOSCOPY WITH PROPOFOL  N/A  09/20/2021   Procedure: COLONOSCOPY WITH PROPOFOL ;  Surgeon: Ruby Corporal, MD;  [facility];  Service: Endoscopy;  Laterality: N/A;  730   CORONARY ANGIOPLASTY  1997   CORONARY ANGIOPLASTY WITH STENT PLACEMENT  2002   Left rotator cuff surgery     Right inguinal hernia repair      reports that he has quit smoking. His smoking use included cigarettes. He has never used smokeless tobacco. He reports current alcohol use. He reports that he does not use drugs. Social History   Socioeconomic History   Marital status: Married    Spouse name: Not on file   Number of children: Not on file   Years of education: Not on file   Highest education level: Not on file  Occupational History   Not on file  Tobacco Use   Smoking status: Former    Types: Cigarettes   Smokeless tobacco: Never  Substance and Sexual Activity   Alcohol use: Yes    Comment: occasionally   Drug use: No   Sexual activity: Not Currently  Other Topics Concern   Not on file  Social History Narrative   Not on file   Social Drivers of Health   Financial Resource Strain: Not on file  Food Insecurity: No Food Insecurity (09/06/2021)   Received from [facility]   Hunger Vital Sign    Worried About Running Out of  Food in the Last Year: Never true    Ran Out of Food in the Last Year: Never true  Transportation Needs: Not on file  Physical Activity: Not on file  Stress: Stress Concern Present (03/28/2020)   Received from [facility] of Occupational Health - Occupational Stress Questionnaire    Feeling of Stress : To some extent  Social Connections: Unknown (10/26/2021)   Received from [facility]   Social Network    Social Network: Not on file  Intimate Partner Violence: Unknown (09/29/2021)   Received from [facility]   HITS    Physically Hurt: Not on file    Insult or Talk Down To: Not on file    Threaten Physical Harm: Not on file    Scream or Curse: Not on file     Functional Status Survey:    No family history on file.  Health Maintenance  Topic Date Due   HEMOGLOBIN A1C  Never done   FOOT EXAM  Never done   OPHTHALMOLOGY EXAM  Never done   Diabetic kidney evaluation - Urine ACR  Never done   DTaP/Tdap/Td (1 - Tdap) Never done   Pneumococcal Vaccine: 50+ Years (1 of 2 - PCV) Never done   Zoster Vaccines- Shingrix (1 of 2) Never done   COVID-19 Vaccine (3 - Moderna risk series) 10/12/2019   Diabetic kidney evaluation - eGFR measurement  09/16/2022   INFLUENZA VACCINE  01/24/2024   Medicare Annual Wellness (AWV)  08/05/2024   HPV VACCINES  Aged Out   Meningococcal B Vaccine  Aged Out    Allergies  Allergen Reactions   Iodinated Contrast Media Rash    Outpatient Encounter Medications as of 12/05/2023  Medication Sig   acetaminophen (TYLENOL) 325 MG tablet Take 650 mg by mouth every 6 (six) hours.   Ascorbic Acid (VITAMIN C) 1000 MG tablet Take 1,000 mg by mouth daily.   aspirin EC 81 MG tablet Take 1 tablet (81 mg total) by mouth daily.   atorvastatin (LIPITOR) 40 MG tablet Take 40 mg by mouth daily.   Cholecalciferol (VITAMIN D-3) 1000 UNITS CAPS Take 1,000 Units by mouth daily.   clopidogrel (PLAVIX) 75 MG tablet Take 1 tablet (75 mg total) by mouth daily.   cyanocobalamin 1000 MCG tablet Take 1,000 mcg by mouth daily.   dorzolamide-timolol (COSOPT) 2-0.5 % ophthalmic solution Place 1 drop into the left eye 2 (two) times daily.   gabapentin (NEURONTIN) 100 MG capsule Take 100 mg by mouth 3 (three) times daily.   latanoprost (XALATAN) 0.005 % ophthalmic solution Place 1 drop into both eyes at bedtime.   lidocaine (LIDODERM) 5 % Place 1 patch onto the skin daily. Remove & Discard patch within 12 hours or as directed by MD Apply to back   metFORMIN (GLUCOPHAGE) 1000 MG tablet Take 1,000 mg by mouth daily with breakfast.   methocarbamol (ROBAXIN) 500 MG tablet Take 500 mg by mouth every 8 (eight) hours as needed for muscle spasms.    Multiple Vitamins-Minerals (CENTRUM SILVER ADULT 50+ PO) Take 1 tablet by mouth daily.   oxyCODONE (ROXICODONE) 5 MG immediate release tablet Take 1 tablet (5 mg total) by mouth every 4 (four) hours as needed for severe pain (pain score 7-10).   pantoprazole (PROTONIX) 40 MG tablet Take 40 mg by mouth 3 (three) times a week.   polyethylene glycol powder (GLYCOLAX/MIRALAX) 17 GM/SCOOP powder Take 17 g by mouth 2 (two) times daily.   ramipril (ALTACE)  1.25 MG capsule Take 1.25 mg by mouth daily.   sennosides-docusate sodium (SENOKOT-S) 8.6-50 MG tablet Take 1 tablet by mouth 2 (two) times daily.   No facility-administered encounter medications on file as of 12/05/2023.    Review of Systems  There were no vitals filed for this visit. There is no height or weight on file to calculate BMI. Physical Exam  Labs reviewed: Basic Metabolic Panel: No results for input(s): NA, K, CL, CO2, GLUCOSE, BUN, CREATININE, CALCIUM, MG, PHOS in the last 8760 hours. Liver Function Tests: No results for input(s): AST, ALT, ALKPHOS, BILITOT, PROT, ALBUMIN in the last 8760 hours. No results for input(s): LIPASE, AMYLASE in the last 8760 hours. No results for input(s): AMMONIA in the last 8760 hours. CBC: No results for input(s): WBC, NEUTROABS, HGB, HCT, MCV, PLT in the last 8760 hours. Cardiac Enzymes: No results for input(s): CKTOTAL, CKMB, CKMBINDEX, TROPONINI in the last 8760 hours. BNP: Invalid input(s): POCBNP No results found for: HGBA1C No results found for: TSH No results found for: VITAMINB12 No results found for: FOLATE No results found for: IRON, TIBC, FERRITIN  Imaging and Procedures obtained prior to SNF admission: No results found.  Assessment/Plan There are no diagnoses linked to this encounter.   Family/ staff Communication:   Labs/tests ordered:
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
## 2023-12-09 ENCOUNTER — Other Ambulatory Visit (HOSPITAL_COMMUNITY)
Admission: RE | Admit: 2023-12-09 | Discharge: 2023-12-09 | Disposition: A | Discharge status: Home / Self Care | Source: Skilled Nursing Facility | Attending: Adult Health | Admitting: Adult Health

## 2023-12-09 DIAGNOSIS — S36116A Major laceration of liver, initial encounter: Secondary | ICD-10-CM | POA: Insufficient documentation

## 2023-12-09 LAB — COMPREHENSIVE METABOLIC PANEL WITH GFR
ALT: 28 U/L (ref 0–44)
AST: 25 U/L (ref 15–41)
Albumin: 3.1 g/dL — ABNORMAL LOW (ref 3.5–5.0)
Alkaline Phosphatase: 232 U/L — ABNORMAL HIGH (ref 38–126)
Anion gap: 10 (ref 5–15)
BUN: 8 mg/dL (ref 8–23)
CO2: 23 mmol/L (ref 22–32)
Calcium: 8.7 mg/dL — ABNORMAL LOW (ref 8.9–10.3)
Chloride: 105 mmol/L (ref 98–111)
Creatinine, Ser: 0.48 mg/dL — ABNORMAL LOW (ref 0.61–1.24)
GFR, Estimated: >60 mL/min (ref >60)
Glucose, Bld: 94 mg/dL (ref 70–99)
Potassium: 3.6 mmol/L (ref 3.5–5.1)
Sodium: 138 mmol/L (ref 135–145)
Total Bilirubin: 1.4 mg/dL — ABNORMAL HIGH (ref 0.0–1.2)
Total Protein: 6.3 g/dL — ABNORMAL LOW (ref 6.5–8.1)

## 2023-12-09 LAB — HEMOGLOBIN AND HEMATOCRIT, BLOOD
HCT: 34.6 % — ABNORMAL LOW (ref 39.0–52.0)
Hemoglobin: 11 g/dL — ABNORMAL LOW (ref 13.0–17.0)

## 2023-12-12 ENCOUNTER — Non-Acute Institutional Stay (SKILLED_NURSING_FACILITY): Payer: Self-pay | Admitting: Adult Health

## 2023-12-12 ENCOUNTER — Encounter: Payer: Self-pay | Admitting: Adult Health

## 2023-12-12 ENCOUNTER — Other Ambulatory Visit: Payer: Self-pay | Admitting: Adult Health

## 2023-12-12 DIAGNOSIS — S3210XD Unspecified fracture of sacrum, subsequent encounter for fracture with routine healing: Secondary | ICD-10-CM | POA: Diagnosis not present

## 2023-12-12 DIAGNOSIS — E1159 Type 2 diabetes mellitus with other circulatory complications: Secondary | ICD-10-CM

## 2023-12-12 DIAGNOSIS — S3210XS Unspecified fracture of sacrum, sequela: Secondary | ICD-10-CM | POA: Diagnosis not present

## 2023-12-12 DIAGNOSIS — I152 Hypertension secondary to endocrine disorders: Secondary | ICD-10-CM | POA: Diagnosis not present

## 2023-12-12 MED ORDER — ATORVASTATIN CALCIUM 40 MG PO TABS: 40.0000 mg | ORAL_TABLET | Freq: Every day | ORAL | 0 refills | Status: AC

## 2023-12-12 MED ORDER — CLOPIDOGREL BISULFATE 75 MG PO TABS: 75.0000 mg | ORAL_TABLET | Freq: Every day | ORAL | 0 refills | Status: AC

## 2023-12-12 MED ORDER — GABAPENTIN 100 MG PO CAPS: 100.0000 mg | ORAL_CAPSULE | Freq: Three times a day (TID) | ORAL | 0 refills | Status: AC

## 2023-12-12 MED ORDER — PANTOPRAZOLE SODIUM 40 MG PO TBEC: 40.0000 mg | DELAYED_RELEASE_TABLET | ORAL | 0 refills | Status: AC

## 2023-12-12 MED ORDER — METHOCARBAMOL 500 MG PO TABS: 500.0000 mg | ORAL_TABLET | Freq: Three times a day (TID) | ORAL | 0 refills | Status: AC | PRN

## 2023-12-12 MED ORDER — DORZOLAMIDE HCL-TIMOLOL MAL 2-0.5 % OP SOLN: 1.0000 [drp] | Freq: Two times a day (BID) | OPHTHALMIC | 0 refills | Status: AC

## 2023-12-12 MED ORDER — LATANOPROST 0.005 % OP SOLN: 1.0000 [drp] | Freq: Every day | OPHTHALMIC | 0 refills | Status: AC

## 2023-12-12 MED ORDER — LIDOCAINE 5 % EX PTCH: 1.0000 | MEDICATED_PATCH | CUTANEOUS | 0 refills | Status: DC

## 2023-12-12 MED ORDER — METFORMIN HCL 1000 MG PO TABS: 1000.0000 mg | ORAL_TABLET | Freq: Every day | ORAL | 0 refills | Status: AC

## 2023-12-12 MED ORDER — RAMIPRIL 1.25 MG PO CAPS: 1.2500 mg | ORAL_CAPSULE | Freq: Every day | ORAL | 0 refills | Status: AC
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
## 2023-12-17 DIAGNOSIS — M545 Low back pain, unspecified: Secondary | ICD-10-CM | POA: Diagnosis not present

## 2023-12-17 DIAGNOSIS — Z681 Body mass index (BMI) 19 or less, adult: Secondary | ICD-10-CM | POA: Diagnosis not present

## 2023-12-17 DIAGNOSIS — S2231XA Fracture of one rib, right side, initial encounter for closed fracture: Secondary | ICD-10-CM | POA: Diagnosis not present

## 2023-12-17 DIAGNOSIS — M6281 Muscle weakness (generalized): Secondary | ICD-10-CM | POA: Diagnosis not present

## 2023-12-17 DIAGNOSIS — D692 Other nonthrombocytopenic purpura: Secondary | ICD-10-CM | POA: Diagnosis not present

## 2023-12-17 DIAGNOSIS — Z0000 Encounter for general adult medical examination without abnormal findings: Secondary | ICD-10-CM | POA: Diagnosis not present

## 2023-12-17 DIAGNOSIS — E1159 Type 2 diabetes mellitus with other circulatory complications: Secondary | ICD-10-CM | POA: Diagnosis not present

## 2023-12-17 DIAGNOSIS — I10 Essential (primary) hypertension: Secondary | ICD-10-CM | POA: Diagnosis not present

## 2023-12-17 DIAGNOSIS — E7849 Other hyperlipidemia: Secondary | ICD-10-CM | POA: Diagnosis not present

## 2023-12-17 DIAGNOSIS — I2510 Atherosclerotic heart disease of native coronary artery without angina pectoris: Secondary | ICD-10-CM | POA: Diagnosis not present

## 2023-12-17 DIAGNOSIS — R262 Difficulty in walking, not elsewhere classified: Secondary | ICD-10-CM | POA: Diagnosis not present

## 2023-12-17 DIAGNOSIS — S2249XD Multiple fractures of ribs, unspecified side, subsequent encounter for fracture with routine healing: Secondary | ICD-10-CM | POA: Diagnosis not present

## 2023-12-17 DIAGNOSIS — Z8673 Personal history of transient ischemic attack (TIA), and cerebral infarction without residual deficits: Secondary | ICD-10-CM | POA: Diagnosis not present

## 2023-12-20 DIAGNOSIS — R262 Difficulty in walking, not elsewhere classified: Secondary | ICD-10-CM | POA: Diagnosis not present

## 2023-12-20 DIAGNOSIS — M545 Low back pain, unspecified: Secondary | ICD-10-CM | POA: Diagnosis not present

## 2023-12-20 DIAGNOSIS — S2249XD Multiple fractures of ribs, unspecified side, subsequent encounter for fracture with routine healing: Secondary | ICD-10-CM | POA: Diagnosis not present

## 2023-12-20 DIAGNOSIS — M6281 Muscle weakness (generalized): Secondary | ICD-10-CM | POA: Diagnosis not present

## 2023-12-24 DIAGNOSIS — M545 Low back pain, unspecified: Secondary | ICD-10-CM | POA: Diagnosis not present

## 2023-12-24 DIAGNOSIS — M6281 Muscle weakness (generalized): Secondary | ICD-10-CM | POA: Diagnosis not present

## 2023-12-24 DIAGNOSIS — R262 Difficulty in walking, not elsewhere classified: Secondary | ICD-10-CM | POA: Diagnosis not present

## 2023-12-24 DIAGNOSIS — S2249XD Multiple fractures of ribs, unspecified side, subsequent encounter for fracture with routine healing: Secondary | ICD-10-CM | POA: Diagnosis not present

## 2023-12-26 DIAGNOSIS — M545 Low back pain, unspecified: Secondary | ICD-10-CM | POA: Diagnosis not present

## 2023-12-26 DIAGNOSIS — S2249XD Multiple fractures of ribs, unspecified side, subsequent encounter for fracture with routine healing: Secondary | ICD-10-CM | POA: Diagnosis not present

## 2023-12-26 DIAGNOSIS — M6281 Muscle weakness (generalized): Secondary | ICD-10-CM | POA: Diagnosis not present

## 2023-12-26 DIAGNOSIS — R262 Difficulty in walking, not elsewhere classified: Secondary | ICD-10-CM | POA: Diagnosis not present

## 2023-12-31 DIAGNOSIS — R262 Difficulty in walking, not elsewhere classified: Secondary | ICD-10-CM | POA: Diagnosis not present

## 2023-12-31 DIAGNOSIS — M545 Low back pain, unspecified: Secondary | ICD-10-CM | POA: Diagnosis not present

## 2023-12-31 DIAGNOSIS — S2249XD Multiple fractures of ribs, unspecified side, subsequent encounter for fracture with routine healing: Secondary | ICD-10-CM | POA: Diagnosis not present

## 2023-12-31 DIAGNOSIS — M6281 Muscle weakness (generalized): Secondary | ICD-10-CM | POA: Diagnosis not present

## 2024-01-03 DIAGNOSIS — S2249XD Multiple fractures of ribs, unspecified side, subsequent encounter for fracture with routine healing: Secondary | ICD-10-CM | POA: Diagnosis not present

## 2024-01-03 DIAGNOSIS — R262 Difficulty in walking, not elsewhere classified: Secondary | ICD-10-CM | POA: Diagnosis not present

## 2024-01-03 DIAGNOSIS — M545 Low back pain, unspecified: Secondary | ICD-10-CM | POA: Diagnosis not present

## 2024-01-03 DIAGNOSIS — M6281 Muscle weakness (generalized): Secondary | ICD-10-CM | POA: Diagnosis not present

## 2024-01-07 DIAGNOSIS — M6281 Muscle weakness (generalized): Secondary | ICD-10-CM | POA: Diagnosis not present

## 2024-01-07 DIAGNOSIS — M545 Low back pain, unspecified: Secondary | ICD-10-CM | POA: Diagnosis not present

## 2024-01-07 DIAGNOSIS — S2249XD Multiple fractures of ribs, unspecified side, subsequent encounter for fracture with routine healing: Secondary | ICD-10-CM | POA: Diagnosis not present

## 2024-01-07 DIAGNOSIS — R262 Difficulty in walking, not elsewhere classified: Secondary | ICD-10-CM | POA: Diagnosis not present

## 2024-01-09 DIAGNOSIS — R262 Difficulty in walking, not elsewhere classified: Secondary | ICD-10-CM | POA: Diagnosis not present

## 2024-01-09 DIAGNOSIS — M6281 Muscle weakness (generalized): Secondary | ICD-10-CM | POA: Diagnosis not present

## 2024-01-09 DIAGNOSIS — M545 Low back pain, unspecified: Secondary | ICD-10-CM | POA: Diagnosis not present

## 2024-01-09 DIAGNOSIS — S2249XD Multiple fractures of ribs, unspecified side, subsequent encounter for fracture with routine healing: Secondary | ICD-10-CM | POA: Diagnosis not present

## 2024-01-14 DIAGNOSIS — M6281 Muscle weakness (generalized): Secondary | ICD-10-CM | POA: Diagnosis not present

## 2024-01-14 DIAGNOSIS — S2249XD Multiple fractures of ribs, unspecified side, subsequent encounter for fracture with routine healing: Secondary | ICD-10-CM | POA: Diagnosis not present

## 2024-01-14 DIAGNOSIS — M545 Low back pain, unspecified: Secondary | ICD-10-CM | POA: Diagnosis not present

## 2024-01-14 DIAGNOSIS — R262 Difficulty in walking, not elsewhere classified: Secondary | ICD-10-CM | POA: Diagnosis not present

## 2024-01-16 DIAGNOSIS — M6281 Muscle weakness (generalized): Secondary | ICD-10-CM | POA: Diagnosis not present

## 2024-01-16 DIAGNOSIS — R262 Difficulty in walking, not elsewhere classified: Secondary | ICD-10-CM | POA: Diagnosis not present

## 2024-01-16 DIAGNOSIS — S2249XD Multiple fractures of ribs, unspecified side, subsequent encounter for fracture with routine healing: Secondary | ICD-10-CM | POA: Diagnosis not present

## 2024-01-16 DIAGNOSIS — M545 Low back pain, unspecified: Secondary | ICD-10-CM | POA: Diagnosis not present

## 2024-01-21 DIAGNOSIS — S2249XD Multiple fractures of ribs, unspecified side, subsequent encounter for fracture with routine healing: Secondary | ICD-10-CM | POA: Diagnosis not present

## 2024-01-21 DIAGNOSIS — R262 Difficulty in walking, not elsewhere classified: Secondary | ICD-10-CM | POA: Diagnosis not present

## 2024-01-21 DIAGNOSIS — M545 Low back pain, unspecified: Secondary | ICD-10-CM | POA: Diagnosis not present

## 2024-01-21 DIAGNOSIS — M6281 Muscle weakness (generalized): Secondary | ICD-10-CM | POA: Diagnosis not present

## 2024-01-23 DIAGNOSIS — H2702 Aphakia, left eye: Secondary | ICD-10-CM | POA: Diagnosis not present

## 2024-01-23 DIAGNOSIS — Z961 Presence of intraocular lens: Secondary | ICD-10-CM | POA: Diagnosis not present

## 2024-01-24 DIAGNOSIS — S2249XD Multiple fractures of ribs, unspecified side, subsequent encounter for fracture with routine healing: Secondary | ICD-10-CM | POA: Diagnosis not present

## 2024-01-24 DIAGNOSIS — M6281 Muscle weakness (generalized): Secondary | ICD-10-CM | POA: Diagnosis not present

## 2024-01-24 DIAGNOSIS — R262 Difficulty in walking, not elsewhere classified: Secondary | ICD-10-CM | POA: Diagnosis not present

## 2024-01-24 DIAGNOSIS — M545 Low back pain, unspecified: Secondary | ICD-10-CM | POA: Diagnosis not present

## 2024-01-29 DIAGNOSIS — R262 Difficulty in walking, not elsewhere classified: Secondary | ICD-10-CM | POA: Diagnosis not present

## 2024-01-29 DIAGNOSIS — S2249XD Multiple fractures of ribs, unspecified side, subsequent encounter for fracture with routine healing: Secondary | ICD-10-CM | POA: Diagnosis not present

## 2024-01-29 DIAGNOSIS — M545 Low back pain, unspecified: Secondary | ICD-10-CM | POA: Diagnosis not present

## 2024-01-29 DIAGNOSIS — M6281 Muscle weakness (generalized): Secondary | ICD-10-CM | POA: Diagnosis not present

## 2024-02-05 DIAGNOSIS — S2249XD Multiple fractures of ribs, unspecified side, subsequent encounter for fracture with routine healing: Secondary | ICD-10-CM | POA: Diagnosis not present

## 2024-02-05 DIAGNOSIS — M6281 Muscle weakness (generalized): Secondary | ICD-10-CM | POA: Diagnosis not present

## 2024-02-05 DIAGNOSIS — R262 Difficulty in walking, not elsewhere classified: Secondary | ICD-10-CM | POA: Diagnosis not present

## 2024-02-05 DIAGNOSIS — M545 Low back pain, unspecified: Secondary | ICD-10-CM | POA: Diagnosis not present

## 2024-02-19 DIAGNOSIS — S2249XD Multiple fractures of ribs, unspecified side, subsequent encounter for fracture with routine healing: Secondary | ICD-10-CM | POA: Diagnosis not present

## 2024-02-19 DIAGNOSIS — M545 Low back pain, unspecified: Secondary | ICD-10-CM | POA: Diagnosis not present

## 2024-02-19 DIAGNOSIS — R262 Difficulty in walking, not elsewhere classified: Secondary | ICD-10-CM | POA: Diagnosis not present

## 2024-02-19 DIAGNOSIS — M6281 Muscle weakness (generalized): Secondary | ICD-10-CM | POA: Diagnosis not present

## 2024-02-21 ENCOUNTER — Other Ambulatory Visit: Payer: Self-pay | Admitting: Adult Health

## 2024-02-26 DIAGNOSIS — M6281 Muscle weakness (generalized): Secondary | ICD-10-CM | POA: Diagnosis not present

## 2024-02-26 DIAGNOSIS — R262 Difficulty in walking, not elsewhere classified: Secondary | ICD-10-CM | POA: Diagnosis not present

## 2024-02-26 DIAGNOSIS — M545 Low back pain, unspecified: Secondary | ICD-10-CM | POA: Diagnosis not present

## 2024-02-26 DIAGNOSIS — S2249XD Multiple fractures of ribs, unspecified side, subsequent encounter for fracture with routine healing: Secondary | ICD-10-CM | POA: Diagnosis not present

## 2024-03-23 DIAGNOSIS — E7849 Other hyperlipidemia: Secondary | ICD-10-CM | POA: Diagnosis not present

## 2024-03-23 DIAGNOSIS — I10 Essential (primary) hypertension: Secondary | ICD-10-CM | POA: Diagnosis not present

## 2024-03-26 ENCOUNTER — Other Ambulatory Visit: Payer: Self-pay | Admitting: Adult Health

## 2024-03-30 DIAGNOSIS — Z8673 Personal history of transient ischemic attack (TIA), and cerebral infarction without residual deficits: Secondary | ICD-10-CM | POA: Diagnosis not present

## 2024-03-30 DIAGNOSIS — I2510 Atherosclerotic heart disease of native coronary artery without angina pectoris: Secondary | ICD-10-CM | POA: Diagnosis not present

## 2024-03-30 DIAGNOSIS — I10 Essential (primary) hypertension: Secondary | ICD-10-CM | POA: Diagnosis not present

## 2024-03-30 DIAGNOSIS — E1159 Type 2 diabetes mellitus with other circulatory complications: Secondary | ICD-10-CM | POA: Diagnosis not present

## 2024-03-30 DIAGNOSIS — E7849 Other hyperlipidemia: Secondary | ICD-10-CM | POA: Diagnosis not present

## 2024-03-30 DIAGNOSIS — Z681 Body mass index (BMI) 19 or less, adult: Secondary | ICD-10-CM | POA: Diagnosis not present

## 2024-03-30 DIAGNOSIS — D692 Other nonthrombocytopenic purpura: Secondary | ICD-10-CM | POA: Diagnosis not present

## 2024-04-02 DIAGNOSIS — E1151 Type 2 diabetes mellitus with diabetic peripheral angiopathy without gangrene: Secondary | ICD-10-CM | POA: Diagnosis not present
